# Patient Record
Sex: Female | Born: 1975 | Race: White | Hispanic: Yes | Marital: Single | Smoking: Never smoker
Health system: Southern US, Community
[De-identification: ages and names within clinical notes are randomized; demographics above are authoritative.]

## PROBLEM LIST (undated history)

## (undated) DIAGNOSIS — D649 Anemia, unspecified: Secondary | ICD-10-CM

## (undated) DIAGNOSIS — R7303 Prediabetes: Secondary | ICD-10-CM

## (undated) HISTORY — PX: HAND SURGERY: SHX662

## (undated) HISTORY — DX: Prediabetes: R73.03

## (undated) HISTORY — PX: TUBAL LIGATION: SHX77

---

## 2000-01-24 ENCOUNTER — Inpatient Hospital Stay (HOSPITAL_COMMUNITY): Admission: AD | Admit: 2000-01-24 | Discharge: 2000-01-24 | Payer: Self-pay | Admitting: Obstetrics & Gynecology

## 2000-03-12 ENCOUNTER — Inpatient Hospital Stay (HOSPITAL_COMMUNITY): Admission: AD | Admit: 2000-03-12 | Discharge: 2000-03-16 | Payer: Self-pay | Admitting: Obstetrics

## 2001-03-21 ENCOUNTER — Emergency Department (HOSPITAL_COMMUNITY): Admission: EM | Admit: 2001-03-21 | Discharge: 2001-03-21 | Payer: Self-pay | Admitting: Emergency Medicine

## 2006-02-27 ENCOUNTER — Encounter (INDEPENDENT_AMBULATORY_CARE_PROVIDER_SITE_OTHER): Payer: Self-pay | Admitting: Specialist

## 2006-02-27 ENCOUNTER — Inpatient Hospital Stay (HOSPITAL_COMMUNITY): Admission: AD | Admit: 2006-02-27 | Discharge: 2006-03-02 | Payer: Self-pay | Admitting: Obstetrics

## 2009-07-30 ENCOUNTER — Emergency Department (HOSPITAL_COMMUNITY): Admission: EM | Admit: 2009-07-30 | Discharge: 2009-07-30 | Payer: Self-pay | Admitting: Family Medicine

## 2009-08-01 ENCOUNTER — Ambulatory Visit (HOSPITAL_COMMUNITY): Admission: RE | Admit: 2009-08-01 | Discharge: 2009-08-01 | Payer: Self-pay | Admitting: Orthopedic Surgery

## 2010-07-18 LAB — DIFFERENTIAL
Basophils Absolute: 0 10*3/uL (ref 0.0–0.1)
Basophils Relative: 0 % (ref 0–1)
Eosinophils Absolute: 0.2 10*3/uL (ref 0.0–0.7)
Lymphocytes Relative: 21 % (ref 12–46)
Lymphs Abs: 1.6 10*3/uL (ref 0.7–4.0)
Monocytes Absolute: 0.4 10*3/uL (ref 0.1–1.0)
Neutro Abs: 5.4 10*3/uL (ref 1.7–7.7)

## 2010-07-18 LAB — CBC
MCHC: 31.3 g/dL (ref 30.0–36.0)
Platelets: 307 10*3/uL (ref 150–400)
RBC: 3.93 MIL/uL (ref 3.87–5.11)
RDW: 17.6 % — ABNORMAL HIGH (ref 11.5–15.5)
WBC: 7.6 10*3/uL (ref 4.0–10.5)

## 2010-07-18 LAB — COMPREHENSIVE METABOLIC PANEL
ALT: 20 U/L (ref 0–35)
CO2: 24 mEq/L (ref 19–32)
Chloride: 109 mEq/L (ref 96–112)
Potassium: 3.2 mEq/L — ABNORMAL LOW (ref 3.5–5.1)
Total Bilirubin: 0.5 mg/dL (ref 0.3–1.2)

## 2010-09-14 NOTE — Op Note (Signed)
[facility]  Patient:    Sarah Guerrero, Sarah Guerrero                     MRN: [id number] Proc. Date: 03/13/00 Adm. Date:  [id number] Attending:  Tammi Sou                           Operative Report  PREOPERATIVE DIAGNOSES:       1. Thirty-nine-plus-week intrauterine pregnancy.                               2. Premature rupture of membranes.                               3. Latent labor with repetitive severe variable                                  decelerations refractory to amnioinfusion.                               4. History of previous cesarean delivery.  POSTOPERATIVE DIAGNOSES:      1. Thirty-nine-plus-week intrauterine pregnancy.                               2. Premature rupture of membranes.                               3. Latent labor with repetitive severe variable                                  decelerations refractory to amnioinfusion.                               4. History of previous cesarean delivery.  OPERATION:                    Secondary low transverse cesarean section via Pfannenstiel.  SURGEON:                      Roseanna Rainbow, M.D.  ANESTHESIA:                   Epidural.  COMPLICATIONS:                None.  ESTIMATED BLOOD LOSS:         600 cc.  FLUIDS:                       1500 cc lactated Ringers.  URINE OUTPUT:                 200 cc clear urine.  INDICATIONS:                  The patient is a para 1 at 39+ weeks with premature rupture of membranes undergoing induction of labor with variable decelerations with oxytocin.  Maximum dilatation 1-2 cm.  FINDINGS:  Infant in cephalic presentation with a loose body cord.  Neonatology present at delivery.  Apgars 9 and 9.  Weight 5 pounds 12 ounces.  Normal uterus, tubes, and ovaries.  DESCRIPTION OF PROCEDURE:     The patient was taken to the operating room where her epidural anesthetic was found to be adequate.  She was then  prepped and draped in the normal sterile fashion in the dorsosupine supine with a leftward tilt.  A Pfannenstiel skin incision was then made due to the previous scar with the scalpel and carried through to the underlying layer of fascia. The fascia was incised in the midline and this incision extended laterally with Mayo scissors.  The superior aspect of the fascial incision was then grasped with Kocher clamps, elevated, and the underlying rectus muscles dissected off sharply.  Attention was then turned to the anterior aspect of this incision, which was manipulated in a similar fashion.  The rectus muscles were then divided in the midline, and the parietal peritoneum identified, extended up, and entered sharply with the scalpel.  The peritoneal incision was then extended superiorly and inferiorly with good visualization of the bladder.  The bladder blade was then inserted, and the vesicouterine peritoneum identified, grasped with pickups, and entered sharply with Metzenbaum scissors.  This incision was then extended laterally and a bladder flap created sharply.  The bladder blade was then reinserted and the lower uterine segment incised in a transverse fashion with the scalpel.  The uterine incision was then extended bluntly.  The bladder blade was removed and the infants head delivered atraumatically.  The nose and mouth were suctioned with the bulb suction and the cord clamped and cut.  The infant was handed off to the awaiting neonatologist.  The placenta was removed, the uterus cleared of all clots and debris.  The uterine incision was repaired with 0-Monocryl in a running lock fashion.  Excellent hemostasis was noted.  The gutters were cleared of all clots.  The fascia was reapproximated with 0-PDS in a running fashion.  The skin was closed with staples.  The patient tolerated the procedure well.  Sponge, lap, and needle counts were correct x 2.  The patient was taken to the PACU in  stable condition. DD:  03/13/00 TD:  03/13/00 Job: [id number]

## 2010-09-14 NOTE — Op Note (Signed)
NAMEJOHNIECE, Sarah Guerrero    ACCOUNT NO.:  [id number]   MEDICAL RECORD NO.:  [id number]          PATIENT TYPE:  INP   LOCATION:  9121                          FACILITY:  [facility]   PHYSICIAN:  Charles A. Clearance Coots, M.D.DATE OF BIRTH:  1975-12-05   DATE OF PROCEDURE:  02/27/2006  DATE OF DISCHARGE:                                 OPERATIVE REPORT   PREOPERATIVE DIAGNOSIS:  Term pregnancy, previous cesarean section x2,  desires a repeat cesarean section, desires permanent sterilization.   POSTOPERATIVE DIAGNOSIS:  Term pregnancy, previous cesarean section x2,  desires a repeat cesarean section, desires permanent sterilization.   PROCEDURE:  Repeat low transverse cesarean section, bilateral partial  salpingectomy.   SURGEON:  Coral Ceo, M.D.   ASSISTANT:  Kathreen Cosier, M.D.   ANESTHESIA:  Spinal.   ESTIMATED BLOOD LOSS:  800 mL   IV FLUIDS:  3700 mL   URINE OUTPUT:  400 mL Foley to gravity.   SPECIMEN:  Approximately 2 cm segments of right and left fallopian tubes.   FINDINGS:  Viable female at 0905, Apgars of 8 at one minute, 9 at five  minutes, weight of 6 pounds and 1 ounce, normal uterus, ovaries and  fallopian tubes.   OPERATION:  The patient was brought to the operating room and after  satisfactory spinal anesthesia, the abdomen was prepped and draped in the  usual sterile fashion, a Pfannenstiel skin incision was made through the  previous scar with a scalpel that was deepened down to the fascia with a  scalpel.  The fascia was nicked in the midline and the fascial incision was  extended to left and to the right with curved Mayo scissors, the superior  and inferior fascial edges were taken off the rectus muscles with both blunt  sharp dissection, the rectus muscle was then bluntly divided in the midline  and peritoneum was entered digitally and was digitally extended to left to  and to the right.  The bladder blade was then positioned and the  vesicouterine fold of peritoneum above the reflection of the urinary bladder  was grasped with forceps and was incised and undermined with Metzenbaum  scissors.  The incision was extended to left and to the right with  Metzenbaum scissors. The bladder flap was developed and the bladder blade  was repositioned in front of the urinary bladder placing it well out of  operative field.  The uterus was then entered transversely in the lower  uterine segment with a scalpel the uterine incision was extended to left and  to right with the bandage scissors.  The occiput was then brought up into  the incision and the vertex was delivered with the aid of fundal pressure  from the assistant. Infant's mouth and nose were suctioned with a suction  bulb and the delivery was completed with the aid of fundal pressure from the  assistant.  The umbilical cord was doubly clamped and cut and the infant was  handed off to the nursery staff.  Cord blood was obtained and the placenta  was manually removed from the uterus intact.  The endometrial surface was  then thoroughly debrided with a dry  lap sponge.  The edges of the uterine  incision were grasped with ring forceps and the uterus was closed with a  continuous interlocking suture of 0 Monocryl.  Hemostasis was excellent.  Attention was then turned above to the tubal ligation procedure.  The left  fallopian tube was grasped in the isthmic area with a Babcock clamp and was  followed from the corneal end to the fimbrial end following the grasp the  Babcock clamp.  A knuckle of tube beneath the Babcock clamp was then ligated  with #1 plain catgut and a section of tube above the knot was excised with  Metzenbaum scissors and submitted to pathology for evaluation.  There was no  active bleeding from the tubal stumps, therefore placed back in their normal  anatomic position.  Same procedure was performed on the opposite side  without complications.  The pelvic cavity  was then thoroughly irrigated with  warm saline solution and all clots were removed.  The abdomen was then  closed as follows. Peritoneum was closed with continuous suture of 2-0  Monocryl.  The fascia was  closed with continuous suture of 0 Vicryl.  Subcutaneous tissue was thoroughly irrigated with warm saline solution.  All  areas of subcutaneous bleeding were coagulated with the Bovie.  Skin was  then closed with continuous subcuticular suture of 3-0 Monocryl.  Sterile  bandage was applied to the incision closure.  The surgical technician  indicated that all needle, sponge and instrument counts were correct x2.  The patient tolerated the procedure well and transported to recovery room in  satisfactory condition.      Charles A. Clearance Coots, M.D.  Electronically Signed     CAH/MEDQ  D:  02/27/2006  T:  02/27/2006  Job:  [id number]

## 2010-09-14 NOTE — Discharge Summary (Signed)
[facility]  Patient:    Sarah Guerrero, Sarah Guerrero                       MRN: [id number] Adm. Date:  03/12/00 Disc. Date: 03/16/00 Dictator:   Maryelizabeth Rowan, M.D.                           Discharge Summary  HOSPITAL COURSE:              Patient was admitted with onset of labor.  A 35 year old G2, P2-0-0-2.  As labor progressed she had repetitive deep variable decelerations and was therefore taken for repeat low transverse cesarean section.  No postoperative complications.  Patient recovered well. Patient was breast-feeding well and on oral contraception for birth control. Staples were removed on day of discharge and Steri-Strips applied. Instructions were given and patient is instructed to follow-up at home health in six weeks for routine postpartum care. DD:  08/14/00 TD:  08/15/00 Job: [id number]

## 2010-09-14 NOTE — Discharge Summary (Signed)
NAMEERNESTENE, COOVER    ACCOUNT NO.:  [id number]   MEDICAL RECORD NO.:  [id number]          PATIENT TYPE:  INP   LOCATION:  9121                          FACILITY:  [facility]   PHYSICIAN:  Charles A. Clearance Coots, M.D.DATE OF BIRTH:  1975-10-07   DATE OF ADMISSION:  02/27/2006  DATE OF DISCHARGE:  03/02/2006                                 DISCHARGE SUMMARY   ADMITTING DIAGNOSES:  Term pregnancy, previous cesarean section x2,  multiparity, desires repeat cesarean section, desires permanent  sterilization.   DISCHARGE DIAGNOSES:  Term pregnancy, previous cesarean section x2,  multiparity, desires repeat cesarean section, desires permanent  sterilization, status post repeat low transverse cesarean section and  bilateral partial salpingectomy.   On February 27, 2006, a viable female delivered at [phone].  Apgars are 8 at 1  minute, 9 at 5 minutes, weight of 2,755 grams, length of 47 cm.  Mother and  infant discharged home in good condition.   REASON FOR ADMISSION:  A 35 year old Hispanic G3-P2 at term, desired repeat  cesarean section and permanent sterilization.   PAST MEDICAL HISTORY:  Surgery, cesarean section x2.   ILLNESSES:  None.   MEDICATIONS:  Prenatal vitamins.   ALLERGIES:  NO KNOWN DRUG ALLERGIES.   SOCIAL HISTORY:  Married.  Negative tobacco, alcohol or recreational drug  use.   PHYSICAL EXAM:  GENERAL:  Well-nourished, well-developed female in no acute  distress.  VITAL SIGNS:  Temperature 36.6, pulse 82, respiratory rate 16, blood  pressure 100/64.  LUNGS:  Clear to auscultation bilaterally.  HEART:  Regular rate and rhythm.  ABDOMEN:  Gravid, nontender.  PELVIC:  Exam was omitted.   ADMITTING LABORATORY VALUES:  Hemoglobin 12, hematocrit 36, white blood cell  count 6,000, platelets 157,000.   HOSPITAL COURSE:  The patient underwent a repeat low transverse cesarean  section and bilateral partial salpingectomy on February 27, 2006.  There are  no intraoperative  complications.  The patient's postoperative course was  uncomplicated.  The patient was discharged home on post-op day #3 in good  condition.   LABORATORY VALUES:  Hemoglobin 11, hematocrit 32, white blood cell count  6,000, platelets 144,000.   DISCHARGE DISPOSITION:  Medications:  Tylox and ibuprofen for pain.  Continue prenatal vitamins.  Routine written instructions were given for  obstetrical discharge after the dissection.  The patient is to call our  office for follow-up appointment in 2 weeks.      Charles A. Clearance Coots, M.D.  Electronically Signed     CAH/MEDQ  D:  03/12/2006  T:  03/12/2006  Job:  [id number]

## 2011-01-09 ENCOUNTER — Emergency Department (HOSPITAL_COMMUNITY): Payer: Self-pay

## 2011-01-09 ENCOUNTER — Emergency Department (HOSPITAL_COMMUNITY)
Admission: EM | Admit: 2011-01-09 | Discharge: 2011-01-09 | Disposition: A | Discharge status: Home / Self Care | Payer: Self-pay | Attending: Emergency Medicine | Admitting: Emergency Medicine

## 2011-01-09 DIAGNOSIS — R000 Tachycardia, unspecified: Secondary | ICD-10-CM | POA: Insufficient documentation

## 2011-01-09 DIAGNOSIS — R05 Cough: Secondary | ICD-10-CM | POA: Insufficient documentation

## 2011-01-09 DIAGNOSIS — R059 Cough, unspecified: Secondary | ICD-10-CM | POA: Insufficient documentation

## 2011-01-09 DIAGNOSIS — E7800 Pure hypercholesterolemia, unspecified: Secondary | ICD-10-CM | POA: Insufficient documentation

## 2011-01-09 DIAGNOSIS — R0602 Shortness of breath: Secondary | ICD-10-CM | POA: Insufficient documentation

## 2011-01-09 DIAGNOSIS — F411 Generalized anxiety disorder: Secondary | ICD-10-CM | POA: Insufficient documentation

## 2011-01-09 LAB — POCT I-STAT, CHEM 8
Chloride: 109 mEq/L (ref 96–112)
Creatinine, Ser: 0.6 mg/dL (ref 0.50–1.10)
Glucose, Bld: 95 mg/dL (ref 70–99)
Hemoglobin: 11.6 g/dL — ABNORMAL LOW (ref 12.0–15.0)
Potassium: 3.3 mEq/L — ABNORMAL LOW (ref 3.5–5.1)
TCO2: 20 mmol/L (ref 0–100)

## 2011-01-09 MED ORDER — IOHEXOL 300 MG/ML  SOLN
80.0000 mL | Freq: Once | INTRAMUSCULAR | Status: AC | PRN
  Administered 2011-01-09: 80 mL via INTRAVENOUS

## 2015-10-06 ENCOUNTER — Ambulatory Visit (INDEPENDENT_AMBULATORY_CARE_PROVIDER_SITE_OTHER): Payer: Self-pay | Admitting: Physician Assistant

## 2015-10-06 VITALS — BP 110/60 | HR 78 | Temp 97.8°F | Resp 14 | Ht 61.0 in | Wt 138.8 lb

## 2015-10-06 DIAGNOSIS — Z1329 Encounter for screening for other suspected endocrine disorder: Secondary | ICD-10-CM

## 2015-10-06 DIAGNOSIS — R87611 Atypical squamous cells cannot exclude high grade squamous intraepithelial lesion on cytologic smear of cervix (ASC-H): Secondary | ICD-10-CM

## 2015-10-06 DIAGNOSIS — R6882 Decreased libido: Secondary | ICD-10-CM

## 2015-10-06 DIAGNOSIS — L298 Other pruritus: Secondary | ICD-10-CM

## 2015-10-06 DIAGNOSIS — M79674 Pain in right toe(s): Secondary | ICD-10-CM

## 2015-10-06 DIAGNOSIS — Z13228 Encounter for screening for other metabolic disorders: Secondary | ICD-10-CM

## 2015-10-06 DIAGNOSIS — Z23 Encounter for immunization: Secondary | ICD-10-CM

## 2015-10-06 DIAGNOSIS — Z124 Encounter for screening for malignant neoplasm of cervix: Secondary | ICD-10-CM

## 2015-10-06 DIAGNOSIS — Z130 Encounter for screening for diseases of the blood and blood-forming organs and certain disorders involving the immune mechanism: Secondary | ICD-10-CM

## 2015-10-06 DIAGNOSIS — Z113 Encounter for screening for infections with a predominantly sexual mode of transmission: Secondary | ICD-10-CM

## 2015-10-06 DIAGNOSIS — Z0000 Encounter for general adult medical examination without abnormal findings: Secondary | ICD-10-CM

## 2015-10-06 DIAGNOSIS — N898 Other specified noninflammatory disorders of vagina: Secondary | ICD-10-CM

## 2015-10-06 DIAGNOSIS — L603 Nail dystrophy: Secondary | ICD-10-CM

## 2015-10-06 DIAGNOSIS — Z13220 Encounter for screening for lipoid disorders: Secondary | ICD-10-CM

## 2015-10-06 LAB — CBC WITH DIFFERENTIAL/PLATELET
BASOS ABS: 0 {cells}/uL (ref 0–200)
Basophils Relative: 0 %
EOS ABS: 124 {cells}/uL (ref 15–500)
EOS PCT: 2 %
HCT: 32.2 % — ABNORMAL LOW (ref 35.0–45.0)
Hemoglobin: 9.4 g/dL — ABNORMAL LOW (ref 11.7–15.5)
Lymphocytes Relative: 26 %
Lymphs Abs: 1612 cells/uL (ref 850–3900)
MCH: 20.4 pg — AB (ref 27.0–33.0)
MCHC: 29.2 g/dL — AB (ref 32.0–36.0)
MCV: 70 fL — AB (ref 80.0–100.0)
MONOS PCT: 6 %
MPV: 10.8 fL (ref 7.5–12.5)
Monocytes Absolute: 372 cells/uL (ref 200–950)
NEUTROS PCT: 66 %
Neutro Abs: 4092 cells/uL (ref 1500–7800)
PLATELETS: 383 10*3/uL (ref 140–400)
RBC: 4.6 MIL/uL (ref 3.80–5.10)
RDW: 16.4 % — ABNORMAL HIGH (ref 11.0–15.0)
WBC: 6.2 10*3/uL (ref 3.8–10.8)

## 2015-10-06 LAB — POCT URINALYSIS DIP (MANUAL ENTRY)
BILIRUBIN UA: NEGATIVE
BILIRUBIN UA: NEGATIVE
Blood, UA: NEGATIVE
GLUCOSE UA: NEGATIVE
NITRITE UA: NEGATIVE
Protein Ur, POC: NEGATIVE
Spec Grav, UA: 1.015
Urobilinogen, UA: 0.2
pH, UA: 7

## 2015-10-06 LAB — LIPID PANEL
CHOL/HDL RATIO: 3.3 ratio (ref <=5.0)
CHOLESTEROL: 167 mg/dL (ref 125–200)
HDL: 51 mg/dL (ref >=46)
LDL Cholesterol: 87 mg/dL (ref <130)
Triglycerides: 143 mg/dL (ref <150)
VLDL: 29 mg/dL (ref <30)

## 2015-10-06 LAB — POC MICROSCOPIC URINALYSIS (UMFC): MUCUS RE: ABSENT

## 2015-10-06 LAB — HEMOGLOBIN A1C
Hgb A1c MFr Bld: 5.7 % — ABNORMAL HIGH (ref <5.7)
MEAN PLASMA GLUCOSE: 117 mg/dL

## 2015-10-06 LAB — POCT WET + KOH PREP
TRICH BY WET PREP: ABSENT
YEAST BY KOH: ABSENT
Yeast by wet prep: ABSENT

## 2015-10-06 LAB — COMPREHENSIVE METABOLIC PANEL
ALK PHOS: 52 U/L (ref 33–115)
ALT: 12 U/L (ref 6–29)
AST: 17 U/L (ref 10–30)
Albumin: 4.1 g/dL (ref 3.6–5.1)
BUN: 9 mg/dL (ref 7–25)
CALCIUM: 8.9 mg/dL (ref 8.6–10.2)
CHLORIDE: 106 mmol/L (ref 98–110)
CO2: 21 mmol/L (ref 20–31)
Creat: 0.68 mg/dL (ref 0.50–1.10)
GLUCOSE: 83 mg/dL (ref 65–99)
POTASSIUM: 3.8 mmol/L (ref 3.5–5.3)
Sodium: 138 mmol/L (ref 135–146)
Total Bilirubin: 0.3 mg/dL (ref 0.2–1.2)
Total Protein: 7.5 g/dL (ref 6.1–8.1)

## 2015-10-06 LAB — TSH: TSH: 4.15 m[IU]/L

## 2015-10-06 NOTE — Patient Instructions (Addendum)
There is no evidence of infection in the vagina or in the urine at this point.  I recommend that you drink lots of water and use a baby diaper ointment (like Balmex or Desitin) to soothe the skin outside the vagina.  Please talk with your husband about your disinterest in sex, and your concerns that he may leave if he is not sexually satisfied.   If your liver tests are normal, which I expect they will be, I will send a prescription for Lamisil to your pharmacy, to treat the nail fungus.   IF you received an x-ray today, you will receive an invoice from [facility]. Please contact [facility] at [phone] with questions or concerns regarding your invoice.   IF you received labwork today, you will receive an invoice from [employer]. Please contact [facility] at [phone] with questions or concerns regarding your invoice.   Our billing staff will not be able to assist you with questions regarding bills from these companies.  You will be contacted with the lab results as soon as they are available. The fastest way to get your results is to activate your My Chart account. Instructions are located on the last page of this paperwork. If you have not heard from [place] regarding the results in 2 weeks, please contact this office.      Keeping You Healthy  Get These Tests 1. Blood Pressure- Have your blood pressure checked once a year by your health care provider.  Normal blood pressure is 120/80. 2. Weight- Have your body mass index (BMI) calculated to screen for obesity.  BMI is measure of body fat based on height and weight.  You can also calculate your own BMI at GravelBags.it. 3. Cholesterol- Have your cholesterol checked every 5 years starting at age 54 then yearly starting at age 72. 26. Chlamydia, HIV, and other sexually transmitted diseases- Get screened every year until age 55, then within three months of each new sexual  provider. 5. Pap Test - Every 1-5 years; discuss with your health care provider. 6. Mammogram- Every 1-2 years starting at age 57--50  Take these medicines  Calcium with Vitamin D-Your body needs 1200 mg of Calcium each day and [id number] IU of Vitamin D daily.  Your body can only absorb 500 mg of Calcium at a time so Calcium must be taken in 2 or 3 divided doses throughout the day.  Multivitamin with folic acid- Once daily if it is possible for you to become pregnant.  Get these Immunizations  Gardasil-Series of three doses; prevents HPV related illness such as genital warts and cervical cancer.  Menactra-Single dose; prevents meningitis.  Tetanus shot- Every 10 years.  Flu shot-Every year.  Take these steps 1. Do not smoke-Your healthcare provider can help you quit.  For tips on how to quit go to www.smokefree.gov or call 1-800 QUITNOW. 2. Be physically active- Exercise 5 days a week for at least 30 minutes.  If you are not already physically active, start slow and gradually work up to 30 minutes of moderate physical activity.  Examples of moderate activity include walking briskly, dancing, swimming, bicycling, etc. 3. Breast Cancer- A self breast exam every month is important for early detection of breast cancer.  For more information and instruction on self breast exams, ask your healthcare provider or https://www.patel.info/. 4. Eat a healthy diet- Eat a variety of healthy foods such as fruits, vegetables, whole grains, low fat milk, low fat cheeses, yogurt, lean meats, poultry  and fish, beans, nuts, tofu, etc.  For more information go to www. Thenutritionsource.org 5. Drink alcohol in moderation- Limit alcohol intake to one drink or less per day. Never drink and drive. 6. Depression- Your emotional health is as important as your physical health.  If you're feeling down or losing interest in things you normally enjoy please talk to your healthcare provider about  being screened for depression. 7. Dental visit- Brush and floss your teeth twice daily; visit your dentist twice a year. 8. Eye doctor- Get an eye exam at least every 2 years. 9. Helmet use- Always wear a helmet when riding a bicycle, motorcycle, rollerblading or skateboarding. 21. Safe sex- If you may be exposed to sexually transmitted infections, use a condom. 11. Seat belts- Seat belts can save your live; always wear one. 12. Smoke/Carbon Monoxide detectors- These detectors need to be installed on the appropriate level of your home. Replace batteries at least once a year. 13. Skin cancer- When out in the sun please cover up and use sunscreen 15 SPF or higher. 14. Violence- If anyone is threatening or hurting you, please tell your healthcare provider.

## 2015-10-06 NOTE — Progress Notes (Signed)
Patient ID: Sarah Guerrero, female     DOB: [birth year], 40 y.o.    MRN: [id number]  PCP: No PCP Per Patient  Chief Complaint  Patient presents with  . Annual Exam    With Pap  . Dysuria    x 5 days    Subjective:    HPI  Presents for Annual Wellness Exam. Her son's girlfriend is present and helps translate, though the patient understands and speaks fair [place].  She complains of vaginal itching, and mild external vaginal irritation with urination, but no burning, urinary urgency or frequency. She had some white vaginal discharge about 3 days ago associated with pelvic cramping when she sneezed or strained. She has a history of vaginal yeast infections, but this feels different. She has tried no type of treatment. No history of STI. She is married and has no other sexual partners. She does express some concern that her libido, which has always been "low" may drive her husband to leave her.  LEFT great toe pain x 3 weeks. No recalled trauma or injury. The pain has interfered with her typical 3 x/week jogging.  Last CPE was about 3 years ago. She recalls that at her last labs she was advised that she had "pre-diabetes" and a possible thyroid condition.   Prior to Admission medications   Not on File     No Known Allergies   There are no active problems to display for this patient.    History reviewed. No pertinent family history.   Social History   Social History  . Marital Status: Single    Spouse Name: N/A  . Number of Children: 3  . Years of Education: [employer] Sch[employer]   Occupational History  . house cleaner    Social History Main Topics  . Smoking status: Never Smoker   . Smokeless tobacco: Not on file  . Alcohol Use: 0.0 oz/week    0 Standard drinks or equivalent per week  . Drug Use: Not on file  . Sexual Activity: Yes    Birth Control/ Protection: Other-see comments     Comment: tubal ligation   Other Topics Concern  . Not on  file   Social History Narrative   Lives with Husband, 1 son and 3 daughters      From [place]        Review of Systems  Constitutional: Negative.   HENT: Negative.   Eyes: Negative.   Respiratory: Negative.   Cardiovascular: Negative.   Gastrointestinal: Negative.   Endocrine: Negative.   Genitourinary: Negative for dysuria, urgency, frequency, hematuria, flank pain, decreased urine volume, vaginal bleeding, vaginal discharge, enuresis, difficulty urinating, genital sores, vaginal pain, menstrual problem, pelvic pain and dyspareunia.       Vaginal itching. Low libido.  Musculoskeletal: Negative for myalgias, back pain, joint swelling, arthralgias, gait problem, neck pain and neck stiffness.       LEFT great toe pain  Skin: Negative.   Allergic/Immunologic: Negative.   Neurological: Negative.   Hematological: Negative.   Psychiatric/Behavioral: Negative.          Objective:  Physical Exam  Constitutional: She is oriented to person, place, and time. Vital signs are normal. She appears well-developed and well-nourished. She is active and cooperative. No distress.  BP 110/60 mmHg  Pulse 78  Temp(Src) 97.8 F (36.6 C) (Oral)  Resp 14  Ht 5\' 1"  (1.549 m)  Wt 138 lb 12.8 oz (62.959 kg)  BMI 26.24 kg/m2  SpO2 100%  LMP 09/18/2015   HENT:  Head: Normocephalic and atraumatic.  Right Ear: Hearing, tympanic membrane, external ear and ear canal normal. No foreign bodies.  Left Ear: Hearing, tympanic membrane, external ear and ear canal normal. No foreign bodies.  Nose: Nose normal.  Mouth/Throat: Uvula is midline, oropharynx is clear and moist and mucous membranes are normal. No oral lesions. Normal dentition. No dental abscesses or uvula swelling. No oropharyngeal exudate.  Eyes: Conjunctivae, EOM and lids are normal. Pupils are equal, round, and reactive to light. Right eye exhibits no discharge. Left eye exhibits no discharge. No scleral icterus.  Fundoscopic exam:       The right eye shows no arteriolar narrowing, no AV nicking, no exudate, no hemorrhage and no papilledema.       The left eye shows no arteriolar narrowing, no AV nicking, no exudate, no hemorrhage and no papilledema.  Neck: Trachea normal, normal range of motion and full passive range of motion without pain. Neck supple. No spinous process tenderness and no muscular tenderness present. No thyroid mass and no thyromegaly present.  Cardiovascular: Normal rate, regular rhythm, normal heart sounds, intact distal pulses and normal pulses.   Pulmonary/Chest: Effort normal and breath sounds normal. She exhibits no tenderness and no retraction. Right breast exhibits no inverted nipple, no mass, no nipple discharge, no skin change and no tenderness. Left breast exhibits no inverted nipple, no mass, no nipple discharge, no skin change and no tenderness. Breasts are symmetrical.  Abdominal: Soft. Normal appearance and bowel sounds are normal. She exhibits no distension and no mass. There is no hepatosplenomegaly. There is no tenderness. There is no rigidity, no rebound, no guarding, no CVA tenderness, no tenderness at McBurney's point and negative Murphy's sign. No hernia. Hernia confirmed negative in the right inguinal area and confirmed negative in the left inguinal area.  Genitourinary: Uterus normal. No breast swelling, tenderness, discharge or bleeding. Pelvic exam was performed with patient supine. No labial fusion. There is no rash, tenderness, lesion or injury on the right labia. There is no rash, tenderness, lesion or injury on the left labia. Cervix exhibits no motion tenderness, no discharge and no friability. Right adnexum displays no mass, no tenderness and no fullness. Left adnexum displays no mass, no tenderness and no fullness. There is tenderness in the vagina. No erythema or bleeding in the vagina. No foreign body around the vagina. No signs of injury around the vagina. Vaginal discharge found.    Musculoskeletal: She exhibits no edema or tenderness.       Cervical back: Normal.       Thoracic back: Normal.       Lumbar back: Normal.  Lymphadenopathy:       Head (right side): No tonsillar, no preauricular, no posterior auricular and no occipital adenopathy present.       Head (left side): No tonsillar, no preauricular, no posterior auricular and no occipital adenopathy present.    She has no cervical adenopathy.    She has no axillary adenopathy.       Right: No inguinal and no supraclavicular adenopathy present.       Left: No inguinal and no supraclavicular adenopathy present.  Neurological: She is alert and oriented to person, place, and time. She has normal strength and normal reflexes. No cranial nerve deficit. She exhibits normal muscle tone. Coordination and gait normal.  Skin: Skin is warm, dry and intact. No rash noted. She is not diaphoretic. No cyanosis or erythema. Nails show no clubbing.  RIGHT great toenail is thickened and lifted from the nail bed, consistent with onychomycosis.  Psychiatric: She has a normal mood and affect. Her speech is normal and behavior is normal. Judgment and thought content normal.    Results for orders placed or performed in visit on 10/06/15  POCT Wet + KOH Prep  Result Value Ref Range   Yeast by KOH Absent Present, Absent   Yeast by wet prep Absent Present, Absent   WBC by wet prep Few None, Few, Too numerous to count   Clue Cells Wet Prep HPF POC Few (A) None, Too numerous to count   Trich by wet prep Absent Present, Absent   Bacteria Wet Prep HPF POC None None, Few, Too numerous to count   Epithelial Cells By [employer] ([facility]) Moderate (A) None, Few, Too numerous to count   RBC,UR,HPF,POC None None RBC/hpf  POCT urinalysis dipstick  Result Value Ref Range   Color, UA yellow yellow   Clarity, UA clear clear   Glucose, UA negative negative   Bilirubin, UA negative negative   Ketones, POC UA negative negative   Spec Grav, UA 1.015     Blood, UA negative negative   pH, UA 7.0    Protein Ur, POC negative negative   Urobilinogen, UA 0.2    Nitrite, UA Negative Negative   Leukocytes, UA small (1+) (A) Negative  POCT Microscopic Urinalysis ([facility])  Result Value Ref Range   WBC,UR,HPF,POC None None WBC/hpf   RBC,UR,HPF,POC None None RBC/hpf   Bacteria None None, Too numerous to count   Mucus Absent Absent   Epithelial Cells, UR Per Microscopy None None, Too numerous to count cells/hpf            Assessment & Plan:  1. Annual physical exam Age appropriate anticipatory guidance provided.  2. Screening for hyperlipidemia - Lipid panel  3. Screening examination for sexually transmitted disease - HIV antibody - Pap IG, CT/NG NAA, and HPV (high risk) - RPR  4. Screening for metabolic disorder - Comprehensive metabolic panel - Hemoglobin A1c - POCT urinalysis dipstick - POCT Microscopic Urinalysis ([facility])  5. Screening for cervical cancer - Pap IG, CT/NG NAA, and HPV (high risk)  6. Screening for thyroid disorder - TSH  7. Screening for deficiency anemia - CBC with Differential/Platelet  8. Need for Tdap vaccination - Tdap vaccine greater than or equal to 7yo IM  9. Vaginal itching No evidence of infection. Encouraged topical barrier cream (Balmex, Desitin). Await labs above. - POCT Wet + KOH Prep  10. Great toe pain, right Due to trauma of thickened nail against shoe. See below.  11. Dystrophic nail Most likely onychomycosis. If liver enzymes are normal, plan to send Lamisil to the pharmacy.  12. Decreased libido without sexual dysfunction Encouraged her to discuss her concerns with her husband to see if this is actually a problem in their relationship. If it is, consider testosterone.   Fara Chute, PA-C Physician Assistant-Certified [facility]

## 2015-10-06 NOTE — Progress Notes (Signed)
Subjective:    Patient ID: Sarah Guerrero, female    DOB: December 15, 1975, 39 y.o.   MRN: [id number]  Chief Complaint  Patient presents with  . Annual Exam    With Pap  . Dysuria    x 5 days   HPI  Patient is 40yo female who presents today for her annual exam with complaints of constant vaginal itching and mild irritation associated with urination.  Admits to 1 episode of a white discharge 3 days ago and a cramping pain in lower pelvis with sneezing and straining.  Denies odor, urinary urgency, frequency, hematuria or incontinence.  History of yeast infections, adequately treated with monostat, but states it does not feel the same and she has not tried any form of treatment.  No prior history of STI.  Currently in a monogamous relationship.  Complains of a decreased libido.  States she has never had a large desire for sexual intercourse but lately has found her low desire diminishing.  She is concerned her husband will get fed up and leave her.   Complains of pain in her left great toe x 3weeks.  Denies trauma, swelling, erythema, or drainage.  Patient diet consist of a variety of foods including fruits, vegetables, and lots of carbs and sugars.  She exercises by running several days out of the week but has decreased recently because of the pain in her toe.    Admits it has been 3 years since her last physical and she currently does not have a primary care provider.  At her last physical she had some abnormal lab work indicating a possible thyroid condition and pre-diabetes.  No lab results for previous A1C or TSH found in EMR.  She is married, has 3 children and had some form of tubal ligation with her last pregnancy.   LMP - 09/18/2015  Patient speaks spanish, and has a minimally fair understanding of english.  Her son's girlfriend is present with her today as an interpretor.  Review of Systems  Constitutional: Negative for fever, activity change and appetite  change.  Cardiovascular: Negative for chest pain, palpitations and leg swelling.  Gastrointestinal: Negative for nausea, vomiting, abdominal pain, diarrhea, constipation, blood in stool and abdominal distention.  Endocrine: Negative for cold intolerance, heat intolerance, polydipsia, polyphagia and polyuria.       Objective:   Physical Exam  Constitutional: She is oriented to person, place, and time. She appears well-developed and well-nourished.  HENT:  Mouth/Throat: Oropharynx is clear and moist.  Eyes: Conjunctivae and EOM are normal. Pupils are equal, round, and reactive to light.  Neck: Normal range of motion. Neck supple. No tracheal deviation present. No thyromegaly present.  Cardiovascular: Normal rate, regular rhythm, normal heart sounds and intact distal pulses.   Pulmonary/Chest: Effort normal and breath sounds normal.  Abdominal: Soft. Bowel sounds are normal. She exhibits no distension. There is no tenderness.  Genitourinary: No breast swelling, tenderness or discharge. There is no rash, tenderness or lesion on the right labia. There is no rash, tenderness or lesion on the left labia. Uterus is not enlarged, not fixed and not tender. Cervix exhibits no motion tenderness, no discharge and no friability. Right adnexum displays no mass and no tenderness. Left adnexum displays no mass and no tenderness. There is tenderness in the vagina. No erythema or bleeding in the vagina. Vaginal discharge found.  Moderate white discharge present in vaginal vault.  Vaginal vault appears shallow.  Musculoskeletal: Normal range of motion.  Feet:  Neurological: She is alert and oriented to person, place, and time. She has normal reflexes.  Skin: Skin is warm and dry.  Psychiatric: She has a normal mood and affect. Her behavior is normal. Judgment and thought content normal.      Assessment & Plan:  1. Annual physical exam Patient is well appearing, healthy, female with no gross  abnormalities.  2. Screening for hyperlipidemia Results pending, will call patient if abnormalities present and determine management options. - Lipid panel  3. Screening examination for sexually transmitted disease On pelvic exam, no lesions, rash or erythema noted.  Samples collected, results pending, will provide treatment if indicated. - HIV antibody - Pap IG, CT/NG NAA, and HPV (high risk) - RPR  4. Screening for metabolic disorder Urinalysis within normal limits. CMP and A1C pending, will report results to patient and determine management if indicated. - Comprehensive metabolic panel - Hemoglobin A1c - POCT urinalysis dipstick - POCT Microscopic Urinalysis ([facility])  5. Screening for cervical cancer Results pending - Pap IG, CT/NG NAA, and HPV (high risk)  6. Screening for thyroid disorder Results pending, if levels abnormal will contact patient for further evaluation and management. - TSH  7. Screening for deficiency anemia Labs pending, will manage as necessary. - CBC with Differential/Platelet  8. Need for Tdap vaccination Given today. - Tdap vaccine greater than or equal to 7yo IM  9. Vaginal itching Wet and KOH prep results within normal limits, suggested patient get OTC diaper rash cream to use to treat symptoms.  Contact this office if symptoms persist for further management. - POCT Wet + KOH Prep  10. Great toe pain, right 11. Dystrophic nail Informed patient if liver function test return normal will send prescription for nail fungus treatment to her pharmacy to treat her likely infection with instructions on its use.  12. Decreased libido without sexual dysfunction Informed patient there are limited options to treat decreased libido in women. Encouraged patient to talk to her husband about her declining interest in sex, to open the doors of communication and possibly strengthen their relationship.  Will look into possible options to manage her decreased  libido.  Informed patient we will contact her with her lab results and address any abnormalities that need to be managed.  If she has any additional concerns or complaints to please contact or return to this office.   Karleen Seebeck P. Forest Pruden, PA-S

## 2015-10-07 LAB — HIV ANTIBODY (ROUTINE TESTING W REFLEX): HIV: NONREACTIVE

## 2015-10-07 LAB — RPR

## 2015-10-10 LAB — PAP IG, CT-NG NAA, HPV HIGH-RISK
CHLAMYDIA PROBE AMP: NOT DETECTED
GC PROBE AMP: NOT DETECTED
HPV DNA High Risk: NOT DETECTED

## 2015-10-19 ENCOUNTER — Ambulatory Visit: Payer: Self-pay

## 2015-10-19 VITALS — BP 132/74 | HR 67 | Temp 98.2°F | Resp 17 | Ht 61.5 in | Wt 140.0 lb

## 2015-10-19 NOTE — Patient Instructions (Signed)
     IF you received an x-ray today, you will receive an invoice from [facility]. Please contact [facility] at [phone] with questions or concerns regarding your invoice.   IF you received labwork today, you will receive an invoice from [facility]. Please contact [facility] at [phone] with questions or concerns regarding your invoice.   Our billing staff will not be able to assist you with questions regarding bills from these companies.  You will be contacted with the lab results as soon as they are available. The fastest way to get your results is to activate your My Chart account. Instructions are located on the last page of this paperwork. If you have not heard from us regarding the results in 2 weeks, please contact this office.      

## 2015-10-19 NOTE — Addendum Note (Signed)
Addended by: Fara Chute on: 10/19/2015 02:03 PM   Modules accepted: Orders

## 2015-10-21 ENCOUNTER — Ambulatory Visit (INDEPENDENT_AMBULATORY_CARE_PROVIDER_SITE_OTHER): Payer: Self-pay | Admitting: Urgent Care

## 2015-10-21 VITALS — BP 118/68 | HR 71 | Temp 98.3°F | Resp 12 | Ht 62.0 in | Wt 138.0 lb

## 2015-10-21 DIAGNOSIS — B379 Candidiasis, unspecified: Secondary | ICD-10-CM

## 2015-10-21 DIAGNOSIS — D649 Anemia, unspecified: Secondary | ICD-10-CM

## 2015-10-21 DIAGNOSIS — R87611 Atypical squamous cells cannot exclude high grade squamous intraepithelial lesion on cytologic smear of cervix (ASC-H): Secondary | ICD-10-CM

## 2015-10-21 MED ORDER — FERROUS SULFATE 325 (65 FE) MG PO TBEC: 325.0000 mg | DELAYED_RELEASE_TABLET | Freq: Three times a day (TID) | ORAL | Status: DC

## 2015-10-21 MED ORDER — DOCUSATE SODIUM 50 MG PO CAPS: 50.0000 mg | ORAL_CAPSULE | Freq: Two times a day (BID) | ORAL | Status: DC

## 2015-10-21 MED ORDER — FLUCONAZOLE 150 MG PO TABS: 150.0000 mg | ORAL_TABLET | ORAL | Status: DC

## 2015-10-21 NOTE — Patient Instructions (Addendum)
Vaginitis monilisica (Monilial Vaginitis) La vaginitis es una inflamacin (irritacin, hinchazn) de la vagina y la vulva. Esta no es una enfermedad de transmisin sexual.  CAUSAS Este tipo de vaginitis lo causa un hongo (candida) que normalmente se encuentra en la vagina. El hongo candida se ha desarrollado hasta el punto de ocasionar problemas en el equilibrio qumico. SNTOMAS  Secrecin vaginal espesa y blanca.  Hinchazn, picazn, enrojecimiento e inflamacin de la vagina y en algunos casos de los labios vaginales (vulva).  Ardor o dolor al [employer].  Dolor en [place]. DIAGNSTICO Los factores que favorecen la vaginitis moniliasica son:  Kyla Balzarine de virginidad y postmenopusicas.  Embarazo.  Infecciones.  Sentir cansancio, estar enferma o estresada, especialmente si ya ha sufrido este problema en el pasado.  Diabetes Buen control ayudar a disminur la probabilidad.  Pldoras anticonceptivas  Ropa interior [place].  El uso de espumas de bao, aerosoles femeninos duchas vaginales o tampones con desodorante.  Algunos antibiticos (medicamentos que destruyen grmenes).  Si contrae alguna enfermedad puede sufrir recurrencias espordicas. [place] profesional que lo asiste prescribir medicamentos.  Hay diferentes tipos de cremas y supositorios vaginales que tratan especficamente la vaginitis monilisica. Para infecciones por hongos recurrentes, utilice un supositorio o crema en la vagina dos veces por semana, o segn se le indique.  Tambin podrn utilizarse cremas con corticoides o anti monilisicas para la picazn o la irritacin de la vulva. Consulte con el profesional que la asiste.  Si la crema no da resultado, podr aplicarse en la vagina una solucin con azul de metileno.  El consumo de yogur puede prevenir este tipo de vaginitis. INSTRUCCIONES [place] todos los medicamentos tal como se le indic.  No  mantenga relaciones sexuales hasta que el tratamiento se haya completado, o segn las indicaciones del profesional que la asiste.  Tome baos de asiento tibios.  No se aplique duchas vaginales.  No utilice tampones, especialmente los perfumados.  Use ropa interior de algodn  [employer] pantalones ajustados y las medias tipo panty.  Comunique a sus compaeros sexuales que sufre una infeccin por hongos. Ellos deben concurrir para un control mdico si tienen sntomas como una urticaria leve o picazn.  Sus compaeros sexuales deben tratarse tambin si la infeccin es difcil de Radiographer, therapeutic.  Practique el sexo seguro - use condones  Algunos medicamentos vaginales ocasionan fallas en los condones de ltex. Los medicamentos vaginales que pueden daar los condones son:  Building services engineer cleocina  Butoconazole (Femstat)  Terconazole (Terazol) supositorios vaginales  Miconazole (Monistat) (es un medicamento de venta libre) SOLICITE ATENCIN MDICA SI:  Waldron Session tiene una temperatura oral de ms de 38,9 C (102 F).  Si la infeccin empeora luego de 2 das de tratamiento.  Si la infeccin no mejora luego de 3 das de tratamiento.  Aparecen ampollas en o alrededor de la vagina.  Si aparece una hemorragia vaginal y no es el momento del perodo.  Siente dolor al [employer].  Presenta problemas intestinales.  Tiene dolor durante las [employer].   Esta informacin no tiene Marine scientist el consejo del mdico. Asegrese de hacerle al mdico cualquier pregunta que tenga.   Document Released: 01/23/2005 Document Revised: 07/08/2011 Elsevier Interactive Patient Education 2016 [place].    Anemia inespecfica (Anemia, Nonspecific) La anemia es una enfermedad en la que la concentracin de glbulos rojos o el nivel de hemoglobina en la sangre estn por debajo de lo normal. La hemoglobina es la sustancia de los glbulos rojos  que lleva el oxgeno a todo el cuerpo. La anemia da como  resultado que los tejidos no reciban la cantidad suficiente de oxgeno.  CAUSAS  Las causas ms frecuentes de anemia son:   Elvina Mattes. El sangrado puede ser interno o externo. Incluye sangrado excesivo debido al perodo (en las mujeres) o por los intestinos.   Dficit nutricional.   Enfermedad renal, tiroidea o heptica crnicas.  Enfermedades de la mdula sea que disminuyen la produccin de glbulos rojos.  Cncer y tratamientos para Science writer.  VIH, sida y sus tratamientos.  Trastornos del bazo que aumentan la destruccin de glbulos rojos.  Enfermedades de [employer].  Destruccin excesiva de glbulos rojos debido a una infeccin, a medicamentos y a Nurse, mental health. SIGNOS Y SNTOMAS   Debilidad leve.   Mareos.   Dolor de [place].  Palpitaciones.   Falta de aire, especialmente con el ejercicio.   Palidez.  Sensibilidad al fro.  Indigestin.  Nuseas.  Dificultad para dormir.  Dificultad para concentrarse. Los sntomas pueden ocurrir repentinamente o pueden Psychologist, forensic.  DIAGNSTICO  Con frecuencia es necesario realizar anlisis de [employer]. Estos ayudan al profesional a Adult nurse. Su mdico controlar la materia fecal para Hydrographic surveyor la presencia de [place] y buscar otras causas de prdida de [place].  TRATAMIENTO  El tratamiento vara segn la causa de la anemia. Las opciones de tratamiento son:   Suplementos de hierro, vitamina 123456, o cido flico.   Medicamentos con hormonas.   Transfusin de [place]. Ser necesaria en los casos de prdida de [place] grave.   Hospitalizacin. Ser necesaria si la prdida de sangre es continua y significativa.   Cambios en la dieta.  Extirpacin del bazo. INSTRUCCIONES PARA EL CUIDADO EN EL HOGAR Cumpla con todas las visitas de control. Generalmente demora varias semanas corregir la anemia, y es muy importante que el mdico controle su enfermedad y su  respuesta al [place]. SOLICITE ATENCIN MDICA DE INMEDIATO SI:   Siente debilidad extrema, falta de aire o dolor en el pecho.   Se siente mareado o tiene dificultad para concentrarse.  Tiene una hemorragia vaginal abundante.   Aparece una erupcin cutnea.   La materia fecal es negra, de aspecto alquitranado.   Se desmaya.   Vomita sangre.   Vomita repetidas veces.   Siente dolor abdominal.  Tiene fiebre o sntomas persistentes durante ms de 2 - 3 das.   Tiene fiebre y los sntomas empeoran repentinamente.   Se deshidrata.  ASEGRESE DE QUE:  Comprende estas instrucciones.  Controlar su afeccin.  Recibir ayuda de inmediato si no mejora o si empeora.   Esta informacin no tiene Marine scientist el consejo del mdico. Asegrese de hacerle al mdico cualquier pregunta que tenga.   Document Released: 04/15/2005 Document Revised: 12/16/2012 Elsevier Interactive Patient Education [employer].     IF you received an x-ray today, you will receive an invoice from [facility]. Please contact [facility] at [phone] with questions or concerns regarding your invoice.   IF you received labwork today, you will receive an invoice from [employer]. Please contact [facility] at [phone] with questions or concerns regarding your invoice.   Our billing staff will not be able to assist you with questions regarding bills from these companies.  You will be contacted with the lab results as soon as they are available. The fastest way to get your results is to activate your My Chart account. Instructions are located on the last  page of this paperwork. If you have not heard from [place] regarding the results in 2 weeks, please contact this office.

## 2015-10-21 NOTE — Progress Notes (Signed)
    MRN: [id number] DOB: [birth year]  Subjective:   Sarah Guerrero is a 40 y.o. female presenting for follow up on lab results.   Patient was seen for an annual physical on 10/06/2015. Pap smear showed ASCUS, candida infection. She has since been referred to Dr. Toney Rakes with [facility]. Patient is concerned because her mother passed away from metastatic cancer. She reports that she has had vaginal itching, dryness, discomfort with sex. She has regular cycles, no heavy flow but admits intermittent vaginal discharge. She also has anemia, has a history of this. She does not take any iron supplements and has not had this worked up.   Sarah Guerrero currently has no medications in their medication list. Also has No Known Allergies.  Sarah Guerrero  has a past medical history of Pre-diabetes. Also  has past surgical history that includes Tubal ligation.  Her family history includes Cancer in her mother.   Objective:   Vitals: BP 118/68 mmHg  Pulse 71  Temp(Src) 98.3 F (36.8 C) (Oral)  Resp 12  Ht 5\' 2"  (1.575 m)  Wt 138 lb (62.596 kg)  BMI 25.23 kg/m2  SpO2 100%  LMP 10/17/2015  Physical Exam  Constitutional: She is oriented to person, place, and time. She appears well-developed and well-nourished.  Cardiovascular: Normal rate.   Pulmonary/Chest: Effort normal.  Neurological: She is alert and oriented to person, place, and time.  Psychiatric: Her mood appears anxious.   Assessment and Plan :   1. Candida infection - Start diflucan, take 2 doses 1 week apart.  2. Pap smear of cervix with ASCUS, cannot exclude HGSIL - Counseled her on this diagnosis. She is aware that she will have to undergo colposcopy with Dr. Toney Rakes.  3. Anemia, unspecified - Start iron supplement, docusate for constipation. Recheck in 4 weeks.  Sarah Eagles, PA-C [facility] [phone] 10/21/2015 8:28 AM

## 2015-11-07 ENCOUNTER — Encounter: Payer: Self-pay | Admitting: Gynecology

## 2015-11-07 ENCOUNTER — Ambulatory Visit (INDEPENDENT_AMBULATORY_CARE_PROVIDER_SITE_OTHER): Payer: Self-pay | Admitting: Gynecology

## 2015-11-07 VITALS — BP 120/86 | Ht 61.0 in | Wt 141.0 lb

## 2015-11-07 DIAGNOSIS — IMO0002 Reserved for concepts with insufficient information to code with codable children: Secondary | ICD-10-CM

## 2015-11-07 DIAGNOSIS — N941 Unspecified dyspareunia: Secondary | ICD-10-CM | POA: Insufficient documentation

## 2015-11-07 DIAGNOSIS — Z87410 Personal history of cervical dysplasia: Secondary | ICD-10-CM | POA: Insufficient documentation

## 2015-11-07 DIAGNOSIS — R102 Pelvic and perineal pain: Secondary | ICD-10-CM

## 2015-11-07 DIAGNOSIS — R896 Abnormal cytological findings in specimens from other organs, systems and tissues: Secondary | ICD-10-CM

## 2015-11-07 NOTE — Progress Notes (Addendum)
   HPI: Patient is a 40 year old with prior tubal ligation was referred to our practice today as a courtesy of her PCP Chelle Jeffrey,PA-C as a result of her recent abnormal Pap smear which had demonstrated the following:   EPITHELIAL CELL ABNORMALITY: ATYPICAL SQUAMOUS CELLS, CANNOT EXCLUDE  HSIL (ASC-H).   Negative HPV  Patient also stating that she has had lower abdominal discomfort and dyspareunia. She reports normal menstrual cycle. She is in the process of being evaluated by her primary care physician because of her hemoglobin of 9.4 and hematocrit 32.2 for which she's currently on iron supplementation.  ROS: A ROS was performed and pertinent positives and negatives are included in the history.  GENERAL: No fevers or chills. HEENT: No change in vision, no earache, sore throat or sinus congestion. NECK: No pain or stiffness. CARDIOVASCULAR: No chest pain or pressure. No palpitations. PULMONARY: No shortness of breath, cough or wheeze. GASTROINTESTINAL: No abdominal pain, nausea, vomiting or diarrhea, melena or bright red blood per rectum. GENITOURINARY: No urinary frequency, urgency, hesitancy or dysuria. MUSCULOSKELETAL: No joint or muscle pain, no back pain, no recent trauma. DERMATOLOGIC: No rash, no itching, no lesions. ENDOCRINE: No polyuria, polydipsia, no heat or cold intolerance. No recent change in weight. HEMATOLOGICAL: No anemia or easy bruising or bleeding. NEUROLOGIC: No headache, seizures, numbness, tingling or weakness. PSYCHIATRIC: No depression, no loss of interest in normal activity or change in sleep pattern.   [id number] pressure 120/86 weight 141 pounds   BMI 26.64 kg/m Gen. appearance well-developed well-nourished female with the above-mentioned complaint Pelvic: Bartholin urethra Skene was within normal limits Vagina: No lesions or discharge Cervix: No lesions or discharge Uterus: Anteverted normal size shape and consistency patient tender specimen more steroids her left  adnexa difficult to discern any large mass. Rectal exam: Not done  Patient then underwent a detail colposcopic evaluation. The external genitalia, perineum and perirectal region were inspected and no lesions were seen. The speculum was introduced into the vagina and a systematic inspection did not demonstrate any lesions in the vagina, fornix or cervix. Acetic acid wasn't applied and no lesions were seen. Then the cervical speculum was utilized transformation sound was visualized and a vigorous ECC was obtained and submitted for histological evaluation.      Assessment Plan: #1 Pap smear with atypical squamous cells of undetermined significance negative HPV but pathologist had mentioned that he could not rule out any higher grade lesion she had a negative colposcopy today. We discussed the   new Pap smear guidelines. Information provided in Spanish. #2 visit result of low abdominal discomfort and tenderness in left lower quadrant and ultrasound will be scheduled here in the office the next few weeks.    Greater than 50% of time was spent in counseling and coordinating care of this patient.   Time of consultation: 30   Minutes.

## 2015-11-07 NOTE — Patient Instructions (Signed)
Colposcopa - Cuidados posteriores (Colposcopy, Care After) Siga estas instrucciones durante las prximas semanas. Estas indicaciones le proporcionan informacin general acerca de cmo deber cuidarse despus del procedimiento. El mdico tambin podr darle instrucciones ms especficas. El tratamiento se ha planificado de acuerdo a las prcticas mdicas actuales, pero a veces se producen problemas. Comunquese con el mdico si tiene algn problema o tiene dudas despus del procedimiento. QU ESPERAR DESPUS DEL PROCEDIMIENTO  Despus del procedimiento, es tpico tener las siguientes sensaciones:  Clicos. Generalmente se calman en algunos minutos.  Dolor. [place].  Aturdimiento. Si esto le ocurre, recustese durante algunos minutos. Podr tener un sangrado leve o una secrecin oscura que debe detenerse en [place]. Durante este tiempo deber usar un apsito sanitario. [place] vaginales y el uso de tampones durante 3 das, o segn lo que le indique su mdico.  Tome slo medicamentos de venta libre o recetados, segn las indicaciones del mdico. No tome aspirina, ya que puede causar hemorragias.  Si utiliza pldoras anticonceptivas, contine tomndolas.  No todos los resultados estarn disponibles durante su visita. En este caso, tenga otra entrevista con su mdico para conocerlos. No suponga que es normal si no tiene noticias de su mdico o del establecimiento de salud. Es Building services engineer seguimiento de todos los [place] de [place].  Siga los consejos de su mdico con respecto a los [place], [place], visitas y [place] de control. SOLICITE ATENCIN MDICA SI:  Aparece una erupcin cutnea.  Tiene problemas con los medicamentos. SOLICITE ATENCIN MDICA DE INMEDIATO SI:  Tiene una hemorragia abundante o elimina cogulos.  Tiene fiebre.  Tiene flujo vaginal  anormal.  Tiene clicos que no se alivian luego de tomar analgsicos.  Se siente mareada, tiene vahdos o se desmaya.  Siente Research scientist (life sciences).   Esta informacin no tiene Marine scientist el consejo del mdico. Asegrese de hacerle al mdico cualquier pregunta que tenga.   Document Released: 02/03/2013 Elsevier Interactive Patient Education [employer].

## 2015-11-09 LAB — PATHOLOGY

## 2015-11-13 ENCOUNTER — Encounter: Payer: Self-pay | Admitting: Gynecology

## 2015-11-13 ENCOUNTER — Ambulatory Visit: Payer: Self-pay | Admitting: Gynecology

## 2015-11-29 ENCOUNTER — Ambulatory Visit (INDEPENDENT_AMBULATORY_CARE_PROVIDER_SITE_OTHER): Payer: Self-pay

## 2015-11-29 ENCOUNTER — Encounter: Payer: Self-pay | Admitting: Gynecology

## 2015-11-29 ENCOUNTER — Other Ambulatory Visit: Payer: Self-pay | Admitting: Gynecology

## 2015-11-29 ENCOUNTER — Ambulatory Visit (INDEPENDENT_AMBULATORY_CARE_PROVIDER_SITE_OTHER): Payer: Self-pay | Admitting: Gynecology

## 2015-11-29 VITALS — BP 128/84

## 2015-11-29 DIAGNOSIS — R102 Pelvic and perineal pain: Secondary | ICD-10-CM

## 2015-11-29 DIAGNOSIS — N941 Unspecified dyspareunia: Secondary | ICD-10-CM

## 2015-11-29 DIAGNOSIS — N8312 Corpus luteum cyst of left ovary: Secondary | ICD-10-CM

## 2015-11-29 DIAGNOSIS — R896 Abnormal cytological findings in specimens from other organs, systems and tissues: Secondary | ICD-10-CM

## 2015-11-29 DIAGNOSIS — IMO0002 Reserved for concepts with insufficient information to code with codable children: Secondary | ICD-10-CM

## 2015-11-29 NOTE — Progress Notes (Signed)
   Patient is a 40 year old that was seen the office as a new patient has a result of referral by her PCP due to abnormal Pap smear which had demonstrated the following:   EPITHELIAL CELL ABNORMALITY: ATYPICAL SQUAMOUS CELLS, CANNOT EXCLUDE  HSIL (ASC-H).   Negative HPV  Patient also stating that she has had lower abdominal discomfort and dyspareunia. She reports normal menstrual cycle. She is in the process of being evaluated by her primary care physician because of her hemoglobin of 9.4 and hematocrit 32.2 for which she's currently on iron supplementation.  She had a negative colposcopy with a negative ECC was instructed to return for follow-up Pap smear in 6 months.  Her ultrasound today demonstrated the following: Uterus measured 10.9 x 7.1 x 5.1 cm with endometrial stripe of 12.9 mm (last menstrual period 11/10/2015/tubal ligation) right ovary was normal. Left ovary thick wall corpus luteum cyst measuring 18 x 23 mm with positive color flow in the periphery. Some fluid was noted in the cul-de-sac 23 x 18 mm.  Assessment/plan: Patient with Pap smear demonstrated ASCUS but negative HPV. Negative colposcopy and benign ECC. Patient will return back to the office in 6 months for follow-up Pap smear. It appears that her discomfort during intercourse is because of inadequate lubrication. I have recommended she buy over-the-counter Astroglyde and use when necessary. Greater than 50% of the time was spent counseling coordinating of care for this patient total time of consultation 10 minutes

## 2016-03-18 ENCOUNTER — Other Ambulatory Visit (HOSPITAL_COMMUNITY): Payer: Self-pay | Admitting: *Deleted

## 2016-03-18 DIAGNOSIS — N644 Mastodynia: Secondary | ICD-10-CM

## 2016-04-18 ENCOUNTER — Ambulatory Visit
Admission: RE | Admit: 2016-04-18 | Discharge: 2016-04-18 | Disposition: A | Discharge status: Home / Self Care | Payer: No Typology Code available for payment source | Source: Ambulatory Visit | Attending: Obstetrics and Gynecology | Admitting: Obstetrics and Gynecology

## 2016-04-18 ENCOUNTER — Ambulatory Visit (HOSPITAL_COMMUNITY)
Admission: RE | Admit: 2016-04-18 | Discharge: 2016-04-18 | Disposition: A | Discharge status: Home / Self Care | Payer: Self-pay | Source: Ambulatory Visit | Attending: Obstetrics and Gynecology | Admitting: Obstetrics and Gynecology

## 2016-04-18 ENCOUNTER — Encounter (HOSPITAL_COMMUNITY): Payer: Self-pay

## 2016-04-18 VITALS — BP 118/72 | Temp 98.2°F | Ht 61.5 in | Wt 146.2 lb

## 2016-04-18 DIAGNOSIS — N644 Mastodynia: Secondary | ICD-10-CM

## 2016-04-18 DIAGNOSIS — Z1239 Encounter for other screening for malignant neoplasm of breast: Secondary | ICD-10-CM

## 2016-04-18 HISTORY — DX: Anemia, unspecified: D64.9

## 2016-04-18 NOTE — Patient Instructions (Signed)
Explained breast self awareness to [facility]. Patient did not need a Pap smear today due to last Pap smear was 10/06/2015. Let patient know that her next Pap smear is due in January 2018 due to her recent history of an abnormal Pap smear and results after colposcopy recommended a 6 month follow up Pap smear. Appointment scheduled with B[facility]CP on Tuesday, May 21, 2016 at 0900 for a Pap smear. Referred patient to the [facility] for diagnostic mammogram and possible left breast ultrasound. Appointment scheduled for Thursday, April 18, 2016. [place] verbalized understanding.  Maureena Dabbs, Arvil Chaco, RN 2:07 PM

## 2016-04-18 NOTE — Progress Notes (Signed)
Complaints of left breast pain x one year that comes and goes. Patient rates pain at a 5 out of 10.  Pap Smear:  Pap smear not completed today. Last Pap smear was 10/06/2015 and ASC-H. Patient had a colposcopy on 11/09/2015 to follow up for abnormal Pap smear that was benign. Per patient her last Pap smear is the only abnormal Pap smear that she has had. Last Pap smear and colposcopy result is in EPIC.   Physical exam: Breasts Breasts symmetrical. No skin abnormalities left breast. Skin on right breast a whitish discoloration observed. Per patient that whitish discoloration occurred 16 years ago. No nipple retraction bilateral breasts. No nipple discharge bilateral breasts. No lymphadenopathy. No lumps palpated bilateral breasts. Complaints of left uppper outer quadrant pain on exam. Referred patient to the [facility] for diagnostic mammogram and possible left breast ultrasound. Appointment scheduled for Thursday, April 18, 2016 at 1400.        Pelvic/Bimanual No Pap smear completed today since last Pap smear was 10/06/2015. Pap smear not indicated per BCCCP guidelines.   Smoking History: Patient has never smoked.  Patient Navigation: Patient education provided. Access to services provided for patient through [facility] program. Spanish interpreter provided.  Used Spanish interpreter [employer] from [place].

## 2016-05-03 ENCOUNTER — Encounter (HOSPITAL_COMMUNITY): Payer: Self-pay | Admitting: *Deleted

## 2016-05-21 ENCOUNTER — Encounter (HOSPITAL_COMMUNITY): Payer: Self-pay

## 2016-05-21 ENCOUNTER — Ambulatory Visit (HOSPITAL_COMMUNITY)
Admission: RE | Admit: 2016-05-21 | Discharge: 2016-05-21 | Disposition: A | Discharge status: Home / Self Care | Payer: Self-pay | Source: Ambulatory Visit | Attending: Obstetrics and Gynecology | Admitting: Obstetrics and Gynecology

## 2016-05-21 VITALS — BP 106/70 | Temp 98.7°F | Ht 60.0 in | Wt 146.8 lb

## 2016-05-21 DIAGNOSIS — Z01419 Encounter for gynecological examination (general) (routine) without abnormal findings: Secondary | ICD-10-CM

## 2016-05-21 NOTE — Progress Notes (Signed)
Complaints of vaginal discharge and pain during sexual intercourse.  Pap Smear: Pap smear completed today. Last Pap smear was 10/06/2015 and ASC-H. Patient had a colposcopy on 11/09/2015 to follow up for abnormal Pap smear that was benign. Per patient her last Pap smear is the only abnormal Pap smear that she has had. Last Pap smear and colposcopy result is in EPIC.   Pelvic/Bimanual   Ext Genitalia No lesions, no swelling and no discharge observed on external genitalia.         Vagina Vagina pink and normal texture. No lesions and yellowish colored discharge observed in vagina. Wet prep completed.          Cervix Cervix is present. Cervix pink and of normal texture. Small amount of yellowish colored discharge observed on cervix.    Uterus Uterus is present and palpable. Uterus in normal position and normal size.        Adnexae Bilateral ovaries present and palpable. No tenderness on palpation.          Rectovaginal No rectal exam completed today since patient had no rectal complaints. No skin abnormalities observed on exam.    Smoking History: Patient has never smoked.  Patient Navigation: Patient education provided. Access to services provided for patient through [facility] program. Spanish interpreter provided.  Used Spanish interpreter [employer] from [place].

## 2016-05-21 NOTE — Patient Instructions (Signed)
Explained to [facility] that follow up for today's Pap smear will be based on the result. Reminded patient that her next mammogram is due in December 2018 and that she will need to renew her BCCCP if still eligible to cover mammogram. Let patient know will follow up with her within the next couple weeks with results of Pap smear and wet prep by phone. [place] verbalized understanding.  Yvonne Stopher, Arvil Chaco, RN 10:59 AM

## 2016-05-21 NOTE — Progress Notes (Signed)
Pap smear completed

## 2016-05-22 ENCOUNTER — Telehealth (HOSPITAL_COMMUNITY): Payer: Self-pay | Admitting: *Deleted

## 2016-05-22 ENCOUNTER — Encounter (HOSPITAL_COMMUNITY): Payer: Self-pay | Admitting: *Deleted

## 2016-05-22 ENCOUNTER — Other Ambulatory Visit (HOSPITAL_COMMUNITY): Payer: Self-pay | Admitting: *Deleted

## 2016-05-22 DIAGNOSIS — B379 Candidiasis, unspecified: Secondary | ICD-10-CM

## 2016-05-22 MED ORDER — FLUCONAZOLE 150 MG PO TABS: 150.0000 mg | ORAL_TABLET | Freq: Once | ORAL | 0 refills | Status: AC

## 2016-05-22 NOTE — Telephone Encounter (Signed)
Telephoned patient at home number and advised patient wet prep results did show yeast. Medication was called into [facility] on [address] and [place]. Patient voiced understanding. Used interpreter Lavon Paganini.

## 2016-05-23 LAB — CYTOLOGY - PAP
DIAGNOSIS: NEGATIVE
HPV (WINDOPATH): NOT DETECTED

## 2016-06-03 ENCOUNTER — Telehealth (HOSPITAL_COMMUNITY): Payer: Self-pay | Admitting: *Deleted

## 2016-06-03 NOTE — Telephone Encounter (Signed)
Telephoned patient at home number and advised patient negative pap smear results. HPV was negative. Next pap smear due in one year. Patient voiced understanding.

## 2016-07-04 ENCOUNTER — Ambulatory Visit (INDEPENDENT_AMBULATORY_CARE_PROVIDER_SITE_OTHER): Payer: Self-pay | Admitting: Emergency Medicine

## 2016-07-04 VITALS — BP 112/76 | HR 70 | Temp 98.4°F | Resp 16 | Ht 62.0 in | Wt 147.2 lb

## 2016-07-04 DIAGNOSIS — B3731 Acute candidiasis of vulva and vagina: Secondary | ICD-10-CM

## 2016-07-04 DIAGNOSIS — B373 Candidiasis of vulva and vagina: Secondary | ICD-10-CM

## 2016-07-04 DIAGNOSIS — N949 Unspecified condition associated with female genital organs and menstrual cycle: Secondary | ICD-10-CM

## 2016-07-04 DIAGNOSIS — N941 Unspecified dyspareunia: Secondary | ICD-10-CM

## 2016-07-04 MED ORDER — FLUCONAZOLE 200 MG PO TABS: 200.0000 mg | ORAL_TABLET | Freq: Every day | ORAL | 1 refills | Status: AC

## 2016-07-04 NOTE — Patient Instructions (Addendum)
   IF you received an x-ray today, you will receive an invoice from [facility]. Please contact [facility] at [phone] with questions or concerns regarding your invoice.   IF you received labwork today, you will receive an invoice from [facility]. Please contact [facility] at [phone] with questions or concerns regarding your invoice.   Our billing staff will not be able to assist you with questions regarding bills from these companies.  You will be contacted with the lab results as soon as they are available. The fastest way to get your results is to activate your My Chart account. Instructions are located on the last page of this paperwork. If you have not heard from us regarding the results in 2 weeks, please contact this office.     Candidiasis vaginal en los adultos (Gastrointestinal Yeast Infection, Adult) La candidiasis vaginal es una afeccin que causa dolor, hinchazn y enrojecimiento (inflamacin) de la vagina. Tambin causa secrecin vaginal. Esta es una enfermedad frecuente. Algunas mujeres contraen esta infeccin con frecuencia. CAUSAS La causa de la infeccin es un cambio en el equilibrio normal de los hongos (cndida) y las bacterias que viven en la vagina. Esta alteracin deriva en el crecimiento excesivo de los hongos, lo que causa la inflamacin. FACTORES DE RIESGO Es ms probable que esta afeccin se manifieste en:  Las mujeres que toman antibiticos.  Las mujeres que tienen diabetes.  Las mujeres que toman anticonceptivos.  Las mujeres que estn embarazadas.  Las mujeres que se hacen duchas vaginales con frecuencia.  Las mujeres que tienen un sistema de defensa (inmunitario) dbil.  Las mujeres que han tomado corticoides durante mucho tiempo.  Las mujeres que usan ropa ajustada con frecuencia. SNTOMAS Los sntomas de esta afeccin incluyen lo siguiente:  Secrecin vaginal blanca y espesa.  Hinchazn, picazn, enrojecimiento e  irritacin de la vagina. Los labios de la vagina (vulva) tambin se pueden infectar.  Dolor o ardor al orinar.  Dolor durante las relaciones sexuales. DIAGNSTICO Esta afeccin se diagnostica mediante la historia clnica y un examen fsico. Este incluye un examen plvico. El mdico examinar una muestra de la secrecin vaginal con un microscopio. Probablemente el mdico enve esta muestra al laboratorio para analizarla y confirmar el diagnstico. TRATAMIENTO Esta afeccin se trata con medicamentos. Los medicamentos pueden ser recetados o de venta libre. Podrn indicarle que use uno o ms de lo siguiente:  Medicamentos por va oral.  Medicamentos que se aplican como una crema.  Medicamentos que se colocan directamente en la vagina (vulos vaginales). INSTRUCCIONES PARA EL CUIDADO EN EL HOGAR  Tome o aplquese los medicamentos de venta libre y recetados solamente como se lo haya indicado el mdico.  No tenga relaciones sexuales hasta que el mdico lo autorice. Comunique a su compaero sexual que tiene una infeccin por hongos. Esas personas deben consultar al mdico si tienen sntomas.  No use ropa ajustada, como pantis o pantalones ajustados.  Evite el uso de tampones hasta que el mdico lo autorice.  Consuma ms yogur. Esto puede ayudar a evitar la recurrencia de la candidiasis.  Intente darse un bao de asiento para aliviar las molestias. Se trata de un bao de agua tibia que se toma mientras se est sentado. El agua solo debe llegar hasta las caderas y cubrir las nalgas. Hgalo 3o 4veces al da o como se lo haya indicado el mdico.  No se haga duchas vaginales.  Use ropa interior transpirable de algodn.  Si tiene diabetes, mantenga bajo control los niveles de   azcar en la sangre. SOLICITE ATENCIN MDICA SI:  Jaclynn Guarneri.  Los sntomas desaparecen y Teacher, adult education.  Los sntomas no mejoran con Dispensing optician.  Los sntomas empeoran.  Aparecen nuevos  sntomas.  Aparecen ampollas alrededor o adentro de la vagina.  Le sale sangre de la vagina y no est menstruando.  Siente dolor en el abdomen. Esta informacin no tiene Marine scientist el consejo del mdico. Asegrese de hacerle al mdico cualquier pregunta que tenga. Document Released: 01/23/2005 Document Revised: 08/07/2015 Document Reviewed: 10/17/2014 Elsevier Interactive Patient Education  2017 [employer].

## 2016-07-04 NOTE — Progress Notes (Signed)
Sarah Guerrero 41 y.o.   Chief Complaint  Patient presents with  . vaginal discharge    per pt "always there", medication that was given has not worked    HISTORY OF PRESENT ILLNESS: This is a 41 y.o. female complaining of vaginal burning, discharge, and painful intercourse on and off x several months; was seen here last June and treated with Diflucan once; was seen by GYN office last January; Pap tests normal and cultures have been consistently positive for Candida; husband not having symptoms; tested for diabetes and negative; denies condom use; has been using OTC vaginal lubricants for intercourse; has decreased libido due to symptoms.   HPI   Prior to Admission medications   Medication Sig Start Date End Date Taking? Authorizing Provider  docusate sodium (COLACE) 50 MG capsule Take 1 capsule (50 mg total) by mouth 2 (two) times daily. 10/21/15  Yes Jaynee Eagles, PA-C  ferrous sulfate 325 (65 FE) MG EC tablet Take 1 tablet (325 mg total) by mouth 3 (three) times daily with meals. 10/21/15  Yes Jaynee Eagles, PA-C    No Known Allergies  Patient Active Problem List   Diagnosis Date Noted  . Dyspareunia in female 11/07/2015  . 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 Anemia   . Pre-diabetes     Past Surgical History:  Procedure Laterality Date  . CESAREAN SECTION     X3  . TUBAL LIGATION      Social History   Social History  . Marital status: Single    Spouse name: N/A  . Number of children: 3  . Years of education: [employer]   Occupational History  . house cleaner    Social History Main Topics  . Smoking status: Never Smoker  . Smokeless tobacco: Never Used  . Alcohol use 0.0 oz/week     Comment: SOCIAL  . Drug use: No  . Sexual activity: Yes    Birth control/ protection: Other-see comments, Surgical     Comment: tubal ligation   Other Topics Concern  . Not on file   Social History Narrative   Lives with Husband, 1 son and 3 daughters      From [place]    Family History  Problem Relation Age of Onset  . Cancer Mother   . Diabetes Paternal Grandmother      Review of Systems  Constitutional: Negative for chills and fever.  HENT: Negative for sore throat.   Eyes: Negative for discharge and redness.  Respiratory: Negative for cough and shortness of breath.   Gastrointestinal: Negative for abdominal pain, diarrhea, nausea and vomiting.  Genitourinary: Negative for dysuria, flank pain and hematuria.       +vaginal burning pain and discharge.  Musculoskeletal: Negative for joint pain.  Skin: Negative for rash.  All other systems reviewed and are negative.  Vitals:   07/04/16 1042  BP: 112/76  Pulse: 70  Resp: 16  Temp: 98.4 F (36.9 C)    Physical Exam  Constitutional: She is oriented to person, place, and time. She appears well-developed and well-nourished.  HENT:  Head: Normocephalic.  Eyes: EOM are normal. Pupils are equal, round, and reactive to light.  Neck: Normal range of motion. Neck supple.  Cardiovascular: Normal rate and regular rhythm.   Pulmonary/Chest: Effort normal and breath sounds normal.  Abdominal: Soft. Bowel sounds are normal. She exhibits no distension. There is no tenderness. Hernia confirmed negative in the right  inguinal area and confirmed negative in the left inguinal area.  Genitourinary: There is no rash on the right labia. There is no rash on the left labia. Cervix exhibits no motion tenderness and no discharge. There is erythema in the vagina. No tenderness or bleeding in the vagina. Vaginal discharge (whitish) found.  Lymphadenopathy:       Right: No inguinal adenopathy present.       Left: No inguinal adenopathy present.  Neurological: She is alert and oriented to person, place, and time.  Skin: Skin is warm and dry. Capillary refill takes less than 2 seconds. No rash noted.  Psychiatric: She has a normal mood and affect. Her behavior  is normal.  Vitals reviewed.    ASSESSMENT & PLAN: Sarah Guerrero was seen today for vaginal discharge.  Diagnoses and all orders for this visit:  Yeast vaginitis -     Ambulatory referral to Gynecology  Vaginal burning -     Ambulatory referral to Gynecology  Dyspareunia in female -     Ambulatory referral to Gynecology  Other orders -     fluconazole (DIFLUCAN) 200 MG tablet; Take 1 tablet (200 mg total) by mouth daily.    Patient Instructions       IF you received an x-ray today, you will receive an invoice from [facility]. Please contact [facility] at [phone] with questions or concerns regarding your invoice.   IF you received labwork today, you will receive an invoice from [facility]. Please contact [facility] at [phone] with questions or concerns regarding your invoice.   Our billing staff will not be able to assist you with questions regarding bills from these companies.  You will be contacted with the lab results as soon as they are available. The fastest way to get your results is to activate your My Chart account. Instructions are located on the last page of this paperwork. If you have not heard from [place] regarding the results in 2 weeks, please contact this office.     Candidiasis vaginal en los adultos (Gastrointestinal Yeast Infection, Adult) La candidiasis vaginal es una afeccin que causa dolor, hinchazn y enrojecimiento (inflamacin) de la vagina. Tambin causa secrecin vaginal. Esta es una enfermedad frecuente. Algunas mujeres contraen esta infeccin con frecuencia. CAUSAS La causa de la infeccin es un cambio en el equilibrio normal de los hongos (cndida) y las bacterias que viven en la vagina. Esta alteracin deriva en el crecimiento excesivo de los hongos, lo que causa la inflamacin. FACTORES DE RIESGO Es ms probable que esta afeccin se manifieste en:  Las mujeres que toman antibiticos.  Las mujeres que tienen  diabetes.  Las mujeres que toman anticonceptivos.  Las mujeres que estn embarazadas.  Las mujeres que se hacen duchas vaginales con frecuencia.  Las mujeres que tienen un sistema de defensa (inmunitario) dbil.  Las mujeres que han tomado corticoides durante mucho tiempo.  Las mujeres que usan ropa ajustada con frecuencia. SNTOMAS Los sntomas de esta afeccin incluyen lo siguiente:  Secrecin vaginal blanca y espesa.  Hinchazn, picazn, enrojecimiento e irritacin de la vagina. Los labios de la vagina (vulva) tambin se pueden infectar.  Dolor o ardor al Garment/textile technologist.  [place]. DIAGNSTICO Esta afeccin se diagnostica mediante la historia clnica y un examen fsico. Este incluye un examen plvico. El mdico examinar una muestra de la secrecin vaginal con un microscopio. Probablemente el mdico enve esta muestra al laboratorio para analizarla y confirmar el diagnstico. TRATAMIENTO Esta afeccin se trata con  medicamentos. Los Dynegy pueden ser recetados o de venta libre. Podrn indicarle que use uno o ms de lo siguiente:  Medicamentos por va oral.  Medicamentos que se aplican como una crema.  Medicamentos que se colocan directamente en la vagina (vulos vaginales). [place] o aplquese los medicamentos de venta libre y Editor, commissioning como se lo haya indicado el mdico.  No tenga relaciones sexuales hasta que el mdico lo autorice. Comunique a su compaero sexual que tiene una infeccin por hongos. Esas personas deben consultar al mdico si tienen sntomas.  No use ropa ajustada, como pantis o pantalones ajustados.  Evite el uso de tampones hasta que el mdico lo autorice.  Consuma ms yogur. Esto puede ayudar a Technical brewer de la candidiasis.  Intente darse un bao de asiento para [employer]. Se trata de un bao de agua tibia que se toma mientras se est sentado. El agua  solo debe Systems analyst las caderas y cubrir las nalgas. Hgalo 3o 4veces al da o como se lo haya indicado el mdico.  No se haga duchas vaginales.  Use ropa interior transpirable de algodn.  Si tiene diabetes, mantenga bajo control los niveles de Dispensing optician. SOLICITE ATENCIN MDICA SI:  Jaclynn Guarneri.  Los sntomas desaparecen y Teacher, adult education.  Los sntomas no mejoran con Dispensing optician.  Los sntomas empeoran.  Aparecen nuevos sntomas.  Aparecen ampollas alrededor o adentro de la vagina.  Le sale sangre de la vagina y no est menstruando.  Siente dolor en el abdomen. Esta informacin no tiene Marine scientist el consejo del mdico. Asegrese de hacerle al mdico cualquier pregunta que tenga. Document Released: 01/23/2005 Document Revised: 08/07/2015 Document Reviewed: 10/17/2014 Elsevier Interactive Patient Education  2017 Elsevier Inc.      Agustina Caroli, MD [facility]

## 2016-07-12 ENCOUNTER — Ambulatory Visit: Payer: Self-pay | Admitting: Gynecology

## 2016-07-12 DIAGNOSIS — Z0289 Encounter for other administrative examinations: Secondary | ICD-10-CM

## 2016-09-11 ENCOUNTER — Encounter: Payer: Self-pay | Admitting: Gynecology

## 2017-03-03 ENCOUNTER — Encounter: Payer: Self-pay | Admitting: Obstetrics & Gynecology

## 2017-03-10 ENCOUNTER — Encounter: Payer: Self-pay | Admitting: Obstetrics & Gynecology

## 2017-03-25 ENCOUNTER — Encounter (HOSPITAL_COMMUNITY): Payer: Self-pay | Admitting: *Deleted

## 2017-03-25 NOTE — Progress Notes (Signed)
Letter mailed to patient to schedule annual mammogram.

## 2017-03-26 ENCOUNTER — Ambulatory Visit (INDEPENDENT_AMBULATORY_CARE_PROVIDER_SITE_OTHER): Payer: Self-pay | Admitting: Obstetrics & Gynecology

## 2017-03-26 ENCOUNTER — Encounter: Payer: Self-pay | Admitting: Obstetrics & Gynecology

## 2017-03-26 VITALS — BP 126/84 | Ht 62.0 in | Wt 146.0 lb

## 2017-03-26 DIAGNOSIS — B3731 Acute candidiasis of vulva and vagina: Secondary | ICD-10-CM

## 2017-03-26 DIAGNOSIS — N898 Other specified noninflammatory disorders of vagina: Secondary | ICD-10-CM

## 2017-03-26 DIAGNOSIS — B9689 Other specified bacterial agents as the cause of diseases classified elsewhere: Secondary | ICD-10-CM

## 2017-03-26 DIAGNOSIS — Z113 Encounter for screening for infections with a predominantly sexual mode of transmission: Secondary | ICD-10-CM

## 2017-03-26 DIAGNOSIS — B373 Candidiasis of vulva and vagina: Secondary | ICD-10-CM

## 2017-03-26 DIAGNOSIS — N760 Acute vaginitis: Secondary | ICD-10-CM

## 2017-03-26 DIAGNOSIS — F5231 Female orgasmic disorder: Secondary | ICD-10-CM

## 2017-03-26 LAB — WET PREP FOR TRICH, YEAST, CLUE

## 2017-03-26 MED ORDER — TINIDAZOLE 500 MG PO TABS: 2.0000 g | ORAL_TABLET | Freq: Every day | ORAL | 2 refills | Status: AC

## 2017-03-26 MED ORDER — FLUCONAZOLE 150 MG PO TABS: 150.0000 mg | ORAL_TABLET | Freq: Every day | ORAL | 2 refills | Status: AC

## 2017-03-26 NOTE — Progress Notes (Signed)
    Sarah Guerrero December 10, 1975 [id number]        41 y.o.  G3P3 Married. Status post Tubal Ligation.  Daughter present at visit.  RP:  Persistent vaginal discharge with odor   HPI:  Increased vaginal discharge x many years..  Currently has an increased vaginal discharge with odor  Pain with IC.  No orgasm with IC, but reaches orgasm with masturbation.  No pelvic pain.  Menses regular and normal every month.  Urine and bowel movements normal.   Past medical history,surgical history, problem list, medications, allergies, family history and social history were all reviewed and documented in the EPIC chart.  Directed ROS with pertinent positives and negatives documented in the history of present illness/assessment and plan.  Exam:  Vitals:   03/26/17 0820  BP: 126/84  Weight: 146 lb (66.2 kg)  Height: 5\' 2"  (1.575 m)   General appearance:  Normal  Gyn exam:  Vulva normal.  Speculum:     Assessment/Plan:  41 y.o. G3P3   1. Vaginal discharge Wet prep showing bacterial vaginosi and yeast vaginitis.  Diagnosis discussed with patient.  Will treat with tinidazole first 4 tablets daily for 2 days and then with fluconazole 1 tablet daily for 3 days.  Usage reviewed.  Recommend probiotic tablet vaginally every week to improve her vaginal flora and prevent recurrence of bacterial vaginosis and yeast vaginitis. - WET PREP FOR TRICH, YEAST, CLUE  2. Vaginal odor As above. - WET PREP FOR [place], YEAST, CLUE  3. Screening examination for venereal disease Given dyspareunia, rule out cervicitis from chlamydia or gonorrhea. - C. trachomatis/N. gonorrhoeae RNA  4. Bacterial vaginosis As above  5. 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  Counseling done on foreplay, clitoral stimulation and active participation during intercourse.   counseling on above issues more than 50% for 25 minutes  Sarah Bruins MD, 8:31 AM  03/26/2017

## 2017-03-27 LAB — C. TRACHOMATIS/N. GONORRHOEAE RNA
C. trachomatis RNA, TMA: NOT DETECTED
N. GONORRHOEAE RNA, TMA: NOT DETECTED

## 2017-03-29 NOTE — Patient Instructions (Signed)
1. Vaginal discharge Wet prep showing bacterial vaginosi and yeast vaginitis.  Diagnosis discussed with patient.  Will treat with tinidazole first 4 tablets daily for 2 days and then with fluconazole 1 tablet daily for 3 days.  Usage reviewed.  Recommend probiotic tablet vaginally every week to improve her vaginal flora and prevent recurrence of bacterial vaginosis and yeast vaginitis. - WET PREP FOR TRICH, YEAST, CLUE  2. Vaginal odor As above. - WET PREP FOR [place], YEAST, CLUE  3. Screening examination for venereal disease Given dyspareunia, rule out cervicitis from chlamydia or gonorrhea. - C. trachomatis/N. gonorrhoeae RNA  4. Bacterial vaginosis As above  5. 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  Counseling done on foreplay, clitoral stimulation and active participation during intercourse.  Verdis [place], [place] un placer encontrarla hoy!  Voy a informarla de Financial trader.   Vaginosis bacteriana (Bacterial Vaginosis) La vaginosis bacteriana es una infeccin vaginal que perturba el equilibrio normal de las bacterias que se encuentran en la vagina. Es el resultado de un crecimiento excesivo de ciertas bacterias. Esta es la infeccin vaginal ms frecuente en mujeres en edad reproductiva. El tratamiento es importante para prevenir complicaciones, especialmente en mujeres embarazadas, dado que puede causar un parto prematuro. CAUSAS La vaginosis bacteriana se origina por un aumento de bacterias nocivas que, generalmente, estn presentes en cantidades ms pequeas en la vagina. Varios tipos diferentes de bacterias pueden causar esta afeccin. Sin embargo, la causa de su desarrollo no se comprende totalmente. [place] o comportamientos pueden exponerlo a un mayor riesgo de desarrollar vaginosis bacteriana, entre los que se incluyen:  Tener una nueva pareja sexual o mltiples parejas sexuales.  Las duchas  vaginales  El uso del DIU (dispositivo intrauterino) como mtodo anticonceptivo. El contagio no se produce en baos, por ropas de cama, en piscinas o por contacto con objetos. SIGNOS Y SNTOMAS Algunas mujeres que padecen vaginosis bacteriana no presentan signos ni sntomas. Los sntomas ms comunes son:  Secrecin vaginal de color grisceo.  Secrecin vaginal con olor similar al [employer], especialmente despus de Retail banker.  Picazn o sensacin de ardor en la vagina o la vulva.  Ardor o dolor al [employer]. DIAGNSTICO Su mdico analizar su historia clnica y le examinar la vagina para detectar signos de vaginosis bacteriana. Puede tomarle Truddie Coco de flujo vaginal. Su mdico examinar esta muestra con un microscopio para controlar las bacterias y clulas anormales. Tambin puede realizarse un anlisis del pH vaginal. TRATAMIENTO La vaginosis bacteriana puede tratarse con antibiticos, en forma de comprimidos o de crema vaginal. Puede indicarse una segunda tanda de antibiticos si la afeccin se repite despus del tratamiento. Debido a que la vaginosis bacteriana aumenta el riesgo de contraer enfermedades de transmisin sexual, el tratamiento puede ayudar a reducir el riesgo de clamidia, [place], VIH y herpes. Conger solo medicamentos de venta libre o recetados, segn las indicaciones del mdico.  Si le han recetado antibiticos, tmelos como se le indic. Asegrese de que finaliza la prescripcin completa aunque se sienta mejor.  Comunique a sus compaeros sexuales que sufre una infeccin vaginal. Deben consultar a su mdico y recibir tratamiento si tienen problemas, como picazn o una erupcin cutnea leve.  Durante el [place], es importante que siga estas indicaciones: ? Teacher, music o use preservativos de Cabin crew. ? No se haga duchas vaginales. ? Evite consumir alcohol como se lo haya  indicado el mdico. ? Evite amamantar como se lo haya indicado el mdico.  SOLICITE ATENCIN MDICA SI:  Sus sntomas no mejoran despus de 3 das de [place].  Aumenta la secrecin o Conservation officer, historic buildings.  Tiene fiebre.  ASEGRESE DE QUE:  Comprende estas instrucciones.  Controlar su afeccin.  Recibir ayuda de inmediato si no mejora o si empeora.  PARA OBTENER MS INFORMACIN Centros para el control y la prevencin de Probation officer for Disease Control and Prevention, CDC): AppraiserFraud.fi Asociacin Estadounidense de la Salud Sexual (American Sexual Health Association, SHA): www.ashastd.org Esta informacin no tiene Marine scientist el consejo del mdico. Asegrese de hacerle al mdico cualquier pregunta que tenga. Document Released: 07/23/2007 Document Revised: 05/06/2014 Document Reviewed: 11/25/2012 Elsevier Interactive Patient Education  2017 Elsevier Inc.  Candidiasis vaginal en los adultos (Gastrointestinal Yeast Infection, Adult) La candidiasis vaginal es una afeccin que causa dolor, hinchazn y enrojecimiento (inflamacin) de la vagina. Tambin causa secrecin vaginal. Esta es una enfermedad frecuente. Algunas mujeres contraen esta infeccin con frecuencia. CAUSAS La causa de la infeccin es un cambio en el equilibrio normal de los hongos (cndida) y las bacterias que viven en la vagina. Esta alteracin deriva en el crecimiento excesivo de los hongos, lo que causa la inflamacin. FACTORES DE RIESGO Es ms probable que esta afeccin se manifieste en:  Las mujeres que toman antibiticos.  Las mujeres que tienen diabetes.  Las mujeres que toman anticonceptivos.  Las mujeres que estn embarazadas.  Las mujeres que se hacen duchas vaginales con frecuencia.  Las mujeres que tienen un sistema de defensa (inmunitario) dbil.  Las mujeres que han tomado corticoides durante mucho tiempo.  Las mujeres que usan ropa ajustada con frecuencia. SNTOMAS Los sntomas  de esta afeccin incluyen lo siguiente:  Secrecin vaginal blanca y espesa.  Hinchazn, picazn, enrojecimiento e irritacin de la vagina. Los labios de la vagina (vulva) tambin se pueden infectar.  Dolor o ardor al Garment/textile technologist.  [place]. DIAGNSTICO Esta afeccin se diagnostica mediante la historia clnica y un examen fsico. Este incluye un examen plvico. El mdico examinar una muestra de la secrecin vaginal con un microscopio. Probablemente el mdico enve esta muestra al laboratorio para analizarla y confirmar el diagnstico. TRATAMIENTO Esta afeccin se trata con medicamentos. Los Dynegy pueden ser recetados o de venta libre. Podrn indicarle que use uno o ms de lo siguiente:  Medicamentos por va oral.  Medicamentos que se aplican como una crema.  Medicamentos que se colocan directamente en la vagina (vulos vaginales). [place] o aplquese los medicamentos de venta libre y Editor, commissioning como se lo haya indicado el mdico.  No tenga relaciones sexuales hasta que el mdico lo autorice. Comunique a su compaero sexual que tiene una infeccin por hongos. Esas personas deben consultar al mdico si tienen sntomas.  No use ropa ajustada, como pantis o pantalones ajustados.  Evite el uso de tampones hasta que el mdico lo autorice.  Consuma ms yogur. Esto puede ayudar a Technical brewer de la candidiasis.  Intente darse un bao de asiento para [employer]. Se trata de un bao de agua tibia que se toma mientras se est sentado. El agua solo debe Systems analyst las caderas y cubrir las nalgas. Hgalo 3o 4veces al da o como se lo haya indicado el mdico.  No se haga duchas vaginales.  Use ropa interior transpirable de algodn.  Si tiene diabetes, mantenga bajo control los niveles de Location manager  en la sangre. SOLICITE ATENCIN MDICA SI:  Jaclynn Guarneri.  Los sntomas desaparecen y Radiographer, therapeutic.  Los sntomas no mejoran con Dispensing optician.  Los sntomas empeoran.  Aparecen nuevos sntomas.  Aparecen ampollas alrededor o adentro de la vagina.  Le sale sangre de la vagina y no est menstruando.  Siente dolor en el abdomen. Esta informacin no tiene Marine scientist el consejo del mdico. Asegrese de hacerle al mdico cualquier pregunta que tenga. Document Released: 01/23/2005 Document Revised: 08/07/2015 Document Reviewed: 10/17/2014 Elsevier Interactive Patient Education  [employer].

## 2017-12-01 ENCOUNTER — Other Ambulatory Visit: Payer: Self-pay

## 2017-12-01 ENCOUNTER — Ambulatory Visit (INDEPENDENT_AMBULATORY_CARE_PROVIDER_SITE_OTHER): Payer: Self-pay | Admitting: Emergency Medicine

## 2017-12-01 ENCOUNTER — Encounter: Payer: Self-pay | Admitting: Emergency Medicine

## 2017-12-01 VITALS — BP 111/68 | HR 77 | Temp 98.7°F | Resp 16 | Ht 61.5 in | Wt 141.6 lb

## 2017-12-01 DIAGNOSIS — N898 Other specified noninflammatory disorders of vagina: Secondary | ICD-10-CM | POA: Insufficient documentation

## 2017-12-01 DIAGNOSIS — R350 Frequency of micturition: Secondary | ICD-10-CM | POA: Insufficient documentation

## 2017-12-01 DIAGNOSIS — B3731 Acute candidiasis of vulva and vagina: Secondary | ICD-10-CM

## 2017-12-01 DIAGNOSIS — B9689 Other specified bacterial agents as the cause of diseases classified elsewhere: Secondary | ICD-10-CM

## 2017-12-01 DIAGNOSIS — B373 Candidiasis of vulva and vagina: Secondary | ICD-10-CM

## 2017-12-01 DIAGNOSIS — N760 Acute vaginitis: Secondary | ICD-10-CM | POA: Insufficient documentation

## 2017-12-01 LAB — POCT URINE PREGNANCY: PREG TEST UR: NEGATIVE

## 2017-12-01 LAB — POCT URINALYSIS DIP (MANUAL ENTRY)
BILIRUBIN UA: NEGATIVE mg/dL
Bilirubin, UA: NEGATIVE
GLUCOSE UA: NEGATIVE mg/dL
Nitrite, UA: NEGATIVE
Protein Ur, POC: NEGATIVE mg/dL
Urobilinogen, UA: 0.2 E.U./dL
pH, UA: 6.5 (ref 5.0–8.0)

## 2017-12-01 LAB — POCT WET + KOH PREP: Trich by wet prep: ABSENT

## 2017-12-01 MED ORDER — TINIDAZOLE 500 MG PO TABS: 2.0000 g | ORAL_TABLET | Freq: Every day | ORAL | 0 refills | Status: AC

## 2017-12-01 MED ORDER — FLUCONAZOLE 150 MG PO TABS: ORAL_TABLET | ORAL | 1 refills | Status: DC

## 2017-12-01 NOTE — Progress Notes (Signed)
Sarah Guerrero Socorro Gonzalez-Hernandez 42 y.o.   Chief Complaint  Patient presents with  . Urinary Frequency    x 1 week and burning at the end, vaginal odor brown color x 2 years    HISTORY OF PRESENT ILLNESS: This is a 42 y.o. female complaining of recurrent vaginal infection for the past 2 years.  Has a burning sensation inside the vagina.  Very painful intercourse.  Sexually active with husband.  Saw gynecologist last November and was diagnosed with both yeast vaginitis and bacterial vaginosis.  Was treated.  Temporarily better.  Symptoms came back soon after.  Symptoms have been chronic, waxing and waning, for the past 1 to 2 years.  No history of diabetes but desires to be tested.  Has persistent brownish discharge.  HPI   Prior to Admission medications   Medication Sig Start Date End Date Taking? Authorizing Provider  multivitamin-iron-minerals-folic acid (CENTRUM) chewable tablet Chew 1 tablet by mouth daily.   Yes [provider]  UNABLE TO [place]   Yes [provider]    No Known Allergies  Patient Active Problem List   Diagnosis Date Noted  . Yeast vaginitis 07/04/2016  . Dyspareunia in female 11/07/2015  . 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 Anemia   . Pre-diabetes     Past Surgical History:  Procedure Laterality Date  . CESAREAN SECTION     X3  . TUBAL LIGATION      Social History   Socioeconomic History  . Marital status: Single    Spouse name: Not on file  . Number of children: 3  . Years of education: [place] [employer]  . Highest education level: Not on file  Occupational History  . Occupation: Electrical engineer  Social Needs  . Financial resource strain: Not on file  . Food insecurity:    Worry: Not on file    Inability: Not on file  . Transportation needs:    Medical: Not on file    Non-medical: Not on file  Tobacco Use  . Smoking status: Never Smoker  . Smokeless tobacco: Never Used    Substance and Sexual Activity  . Alcohol use: Yes    Alcohol/week: 0.0 oz    Comment: SOCIAL  . Drug use: No  . Sexual activity: Yes    Partners: Male    Birth control/protection: Other-see comments, Surgical    Comment: 1st intercourse- 42, married - 71 yrs   Lifestyle  . Physical activity:    Days per week: Not on file    Minutes per session: Not on file  . Stress: Not on file  Relationships  . Social connections:    Talks on phone: Not on file    Gets together: Not on file    Attends religious service: Not on file    Active member of club or organization: Not on file    Attends meetings of clubs or organizations: Not on file    Relationship status: Not on file  . Intimate partner violence:    Fear of current or ex partner: Not on file    Emotionally abused: Not on file    Physically abused: Not on file    Forced sexual activity: Not on file  Other Topics Concern  . Not on file  Social History Narrative   Lives with Husband, 1 son and 3 daughters      From [place]    Family History  Problem  Relation Age of Onset  . Cancer Mother   . Diabetes Paternal Grandmother      Review of Systems  Constitutional: Negative.  Negative for chills, fever and weight loss.  HENT: Negative for sore throat.   Eyes: Negative.   Respiratory: Negative.  Negative for cough and shortness of breath.   Cardiovascular: Negative.  Negative for chest pain and palpitations.  Gastrointestinal: Negative for nausea and vomiting.  Genitourinary: Positive for dysuria. Negative for hematuria.       Vaginal discharge  Skin: Negative.  Negative for rash.  Neurological: Negative.  Negative for dizziness and headaches.  Endo/Heme/Allergies: Negative.   All other systems reviewed and are negative.   Vitals:   12/01/17 1710  BP: 111/68  Pulse: 77  Resp: 16  Temp: 98.7 F (37.1 C)  SpO2: 100%    Physical Exam  Constitutional: She is oriented to person, place, and time. She appears  well-developed and well-nourished.  HENT:  Head: Normocephalic and atraumatic.  Nose: Nose normal.  Mouth/Throat: Oropharynx is clear and moist.  Eyes: Pupils are equal, round, and reactive to light. Conjunctivae and EOM are normal.  Neck: Normal range of motion. Neck supple.  Cardiovascular: Normal rate and regular rhythm.  Pulmonary/Chest: Effort normal and breath sounds normal.  Abdominal: Soft. Bowel sounds are normal. She exhibits no distension. There is no tenderness. Hernia confirmed negative in the right inguinal area and confirmed negative in the left inguinal area.  Genitourinary: There is no rash or tenderness on the right labia. There is no rash or tenderness on the left labia. Cervix exhibits no motion tenderness, no discharge and no friability. There is erythema and tenderness in the vagina. Vaginal discharge found.  Musculoskeletal: Normal range of motion.  Lymphadenopathy: No inguinal adenopathy noted on the left side.  Neurological: She is alert and oriented to person, place, and time. No sensory deficit. She exhibits normal muscle tone.  Skin: Skin is warm and dry. Capillary refill takes less than 2 seconds.  Psychiatric: She has a normal mood and affect.  Vitals reviewed.     Results for orders placed or performed in visit on 12/01/17 (from the past 24 hour(s))  POCT urinalysis dipstick     Status: Abnormal   Collection Time: 12/01/17  5:21 PM  Result Value Ref Range   Color, UA yellow yellow   Clarity, UA clear clear   Glucose, UA negative negative mg/dL   Bilirubin, UA negative negative   Ketones, POC UA negative negative mg/dL   Spec Grav, UA <=1.005 (A) 1.010 - 1.025   Blood, UA trace-intact (A) negative   pH, UA 6.5 5.0 - 8.0   Protein Ur, POC negative negative mg/dL   Urobilinogen, UA 0.2 0.2 or 1.0 E.U./dL   Nitrite, UA Negative Negative   Leukocytes, UA Small (1+) (A) Negative  POCT urine pregnancy     Status: None   Collection Time: 12/01/17  5:24 PM    Result Value Ref Range   Preg Test, Ur Negative Negative  POCT Wet + KOH Prep     Status: Abnormal   Collection Time: 12/01/17  6:20 PM  Result Value Ref Range   Yeast by KOH Present (A) Absent   Yeast by wet prep Present (A) Absent   WBC by wet prep Moderate (A) Few   Clue Cells Wet Prep HPF POC Moderate (A) None   Trich by wet prep Absent Absent   Bacteria Wet Prep HPF POC Few Few   Epithelial  Cells By [employer] (UMFC) Moderate (A) None, Few, Too numerous to count   RBC,UR,HPF,POC None None RBC/hpf    ASSESSMENT & PLAN: Yousra was seen today for urinary frequency.  Diagnoses and all orders for this visit:  Vaginal discharge -     POCT urinalysis dipstick -     POCT urine pregnancy -     POCT Wet + KOH Prep  Urinary frequency -     POCT urinalysis dipstick -     POCT urine pregnancy  Yeast vaginitis -     CBC with Differential/Platelet -     Comprehensive metabolic panel -     Hemoglobin A1c  Bacterial vaginosis  Other orders -     tinidazole (TINDAMAX) 500 MG tablet; Take 4 tablets (2,000 mg total) by mouth daily with breakfast for 2 days. -     fluconazole (DIFLUCAN) 150 MG tablet; Sig one (1) tablet daily for 3 days.    Patient Instructions       IF you received an x-ray today, you will receive an invoice from [facility] Radiology. Please contact [facility] Radiology at [phone] with questions or concerns regarding your invoice.   IF you received labwork today, you will receive an invoice from [facility]. Please contact [facility] at [phone] with questions or concerns regarding your invoice.   Our billing staff will not be able to assist you with questions regarding bills from these companies.  You will be contacted with the lab results as soon as they are available. The fastest way to get your results is to activate your My Chart account. Instructions are located on the last page of this paperwork. If you have not heard from [place] regarding the results in  2 weeks, please contact this office.     Vaginosis bacteriana (Bacterial Vaginosis) La vaginosis bacteriana es una infeccin de la vagina. Se produce cuando crece una cantidad excesiva de grmenes normales (bacterias sanas) en la vagina. Esta infeccin aumenta el riesgo de contraer otras infecciones de transmisin sexual. El tratamiento de esta infeccin puede ayudar a reducir el riesgo de otras infecciones, como:  Clamidia.  Roderick Pee.  VIH.  Herpes. [place] los medicamentos tal como se lo indic su mdico.  Finalice la prescripcin completa, aunque comience a sentirse mejor.  Comunique a sus compaeros sexuales que sufre una infeccin. Deben consultar a su mdico para iniciar un tratamiento.  Durante el tratamiento: ? Teacher, music o use preservativos de Cabin crew. ? No se haga duchas vaginales. ? No consuma alcohol a menos que el mdico lo autorice. ? No amamante a menos que el mdico la autorice.  SOLICITE AYUDA SI:  No mejora luego de 3 das de tratamiento.  Observa una secrecin (prdida) de color gris ms abundante que proviene de la vagina.  Siente ms dolor que antes.  Tiene fiebre.  ASEGRESE DE QUE:  Comprende estas instrucciones.  Controlar su afeccin.  Recibir ayuda de inmediato si no mejora o si empeora.  Esta informacin no tiene Marine scientist el consejo del mdico. Asegrese de hacerle al mdico cualquier pregunta que tenga. Document Released: 07/12/2008 Document Revised: 08/07/2015 Document Reviewed: 11/25/2012 Elsevier Interactive Patient Education  2017 Elsevier Inc.  Candidiasis vaginal en los adultos (Gastrointestinal Yeast Infection, Adult) La candidiasis vaginal es una afeccin que causa dolor, hinchazn y enrojecimiento (inflamacin) de la vagina. Tambin causa secrecin vaginal. Esta es una enfermedad frecuente. Algunas mujeres contraen esta infeccin con frecuencia. [place]  de la infeccin es un cambio en el equilibrio normal de los hongos (cndida) y las bacterias que viven en la vagina. Esta alteracin deriva en el crecimiento excesivo de los hongos, lo que causa la inflamacin. FACTORES DE RIESGO Es ms probable que esta afeccin se manifieste en:  Las mujeres que toman antibiticos.  Las mujeres que tienen diabetes.  Las mujeres que toman anticonceptivos.  Las mujeres que estn embarazadas.  Las mujeres que se hacen duchas vaginales con frecuencia.  Las mujeres que tienen un sistema de defensa (inmunitario) dbil.  Las mujeres que han tomado corticoides durante mucho tiempo.  Las mujeres que usan ropa ajustada con frecuencia. SNTOMAS Los sntomas de esta afeccin incluyen lo siguiente:  Secrecin vaginal blanca y espesa.  Hinchazn, picazn, enrojecimiento e irritacin de la vagina. Los labios de la vagina (vulva) tambin se pueden infectar.  Dolor o ardor al Garment/textile technologist.  [place]. DIAGNSTICO Esta afeccin se diagnostica mediante la historia clnica y un examen fsico. Este incluye un examen plvico. El mdico examinar una muestra de la secrecin vaginal con un microscopio. Probablemente el mdico enve esta muestra al laboratorio para analizarla y confirmar el diagnstico. TRATAMIENTO Esta afeccin se trata con medicamentos. Los Dynegy pueden ser recetados o de venta libre. Podrn indicarle que use uno o ms de lo siguiente:  Medicamentos por va oral.  Medicamentos que se aplican como una crema.  Medicamentos que se colocan directamente en la vagina (vulos vaginales). [place] o aplquese los medicamentos de venta libre y Editor, commissioning como se lo haya indicado el mdico.  No tenga relaciones sexuales hasta que el mdico lo autorice. Comunique a su compaero sexual que tiene una infeccin por hongos. Esas personas deben consultar al mdico si tienen  sntomas.  No use ropa ajustada, como pantis o pantalones ajustados.  Evite el uso de tampones hasta que el mdico lo autorice.  Consuma ms yogur. Esto puede ayudar a Technical brewer de la candidiasis.  Intente darse un bao de asiento para [employer]. Se trata de un bao de agua tibia que se toma mientras se est sentado. El agua solo debe Systems analyst las caderas y cubrir las nalgas. Hgalo 3o 4veces al da o como se lo haya indicado el mdico.  No se haga duchas vaginales.  Use ropa interior transpirable de algodn.  Si tiene diabetes, mantenga bajo control los niveles de Dispensing optician. SOLICITE ATENCIN MDICA SI:  Jaclynn Guarneri.  Los sntomas desaparecen y Teacher, adult education.  Los sntomas no mejoran con Dispensing optician.  Los sntomas empeoran.  Aparecen nuevos sntomas.  Aparecen ampollas alrededor o adentro de la vagina.  Le sale sangre de la vagina y no est menstruando.  Siente dolor en el abdomen. Esta informacin no tiene Marine scientist el consejo del mdico. Asegrese de hacerle al mdico cualquier pregunta que tenga. Document Released: 01/23/2005 Document Revised: 08/07/2015 Document Reviewed: 10/17/2014 Elsevier Interactive Patient Education  2018 Elsevier Inc.      Agustina Caroli, MD [facility]

## 2017-12-01 NOTE — Patient Instructions (Addendum)
IF you received an x-ray today, you will receive an invoice from [facility] Radiology. Please contact [facility] Radiology at [phone] with questions or concerns regarding your invoice.   IF you received labwork today, you will receive an invoice from [facility]. Please contact [facility] at [phone] with questions or concerns regarding your invoice.   Our billing staff will not be able to assist you with questions regarding bills from these companies.  You will be contacted with the lab results as soon as they are available. The fastest way to get your results is to activate your My Chart account. Instructions are located on the last page of this paperwork. If you have not heard from [place] regarding the results in 2 weeks, please contact this office.     Vaginosis bacteriana (Bacterial Vaginosis) La vaginosis bacteriana es una infeccin de la vagina. Se produce cuando crece una cantidad excesiva de grmenes normales (bacterias sanas) en la vagina. Esta infeccin aumenta el riesgo de contraer otras infecciones de transmisin sexual. El tratamiento de esta infeccin puede ayudar a reducir el riesgo de otras infecciones, como:  Clamidia.  Roderick Pee.  VIH.  Herpes. [place] los medicamentos tal como se lo indic su mdico.  Finalice la prescripcin completa, aunque comience a sentirse mejor.  Comunique a sus compaeros sexuales que sufre una infeccin. Deben consultar a su mdico para iniciar un tratamiento.  Durante el tratamiento: ? Teacher, music o use preservativos de Cabin crew. ? No se haga duchas vaginales. ? No consuma alcohol a menos que el mdico lo autorice. ? No amamante a menos que el mdico la autorice.  SOLICITE AYUDA SI:  No mejora luego de 3 das de tratamiento.  Observa una secrecin (prdida) de color gris ms abundante que proviene de la vagina.  Siente ms dolor que antes.  Tiene fiebre.  ASEGRESE DE  QUE:  Comprende estas instrucciones.  Controlar su afeccin.  Recibir ayuda de inmediato si no mejora o si empeora.  Esta informacin no tiene Marine scientist el consejo del mdico. Asegrese de hacerle al mdico cualquier pregunta que tenga. Document Released: 07/12/2008 Document Revised: 08/07/2015 Document Reviewed: 11/25/2012 Elsevier Interactive Patient Education  2017 Elsevier Inc.  Candidiasis vaginal en los adultos (Gastrointestinal Yeast Infection, Adult) La candidiasis vaginal es una afeccin que causa dolor, hinchazn y enrojecimiento (inflamacin) de la vagina. Tambin causa secrecin vaginal. Esta es una enfermedad frecuente. Algunas mujeres contraen esta infeccin con frecuencia. CAUSAS La causa de la infeccin es un cambio en el equilibrio normal de los hongos (cndida) y las bacterias que viven en la vagina. Esta alteracin deriva en el crecimiento excesivo de los hongos, lo que causa la inflamacin. FACTORES DE RIESGO Es ms probable que esta afeccin se manifieste en:  Las mujeres que toman antibiticos.  Las mujeres que tienen diabetes.  Las mujeres que toman anticonceptivos.  Las mujeres que estn embarazadas.  Las mujeres que se hacen duchas vaginales con frecuencia.  Las mujeres que tienen un sistema de defensa (inmunitario) dbil.  Las mujeres que han tomado corticoides durante mucho tiempo.  Las mujeres que usan ropa ajustada con frecuencia. SNTOMAS Los sntomas de esta afeccin incluyen lo siguiente:  Secrecin vaginal blanca y espesa.  Hinchazn, picazn, enrojecimiento e irritacin de la vagina. Los labios de la vagina (vulva) tambin se pueden infectar.  Dolor o ardor al Garment/textile technologist.  [place]. DIAGNSTICO Esta afeccin se diagnostica mediante la historia clnica y un examen  fsico. Este incluye un examen plvico. El mdico examinar Truddie Coco de la secrecin vaginal con un microscopio. Probablemente el mdico  enve esta muestra al laboratorio para analizarla y confirmar el diagnstico. TRATAMIENTO Esta afeccin se trata con medicamentos. Los Dynegy pueden ser recetados o de venta libre. Podrn indicarle que use uno o ms de lo siguiente:  Medicamentos por va oral.  Medicamentos que se aplican como una crema.  Medicamentos que se colocan directamente en la vagina (vulos vaginales). [place] o aplquese los medicamentos de venta libre y Editor, commissioning como se lo haya indicado el mdico.  No tenga relaciones sexuales hasta que el mdico lo autorice. Comunique a su compaero sexual que tiene una infeccin por hongos. Esas personas deben consultar al mdico si tienen sntomas.  No use ropa ajustada, como pantis o pantalones ajustados.  Evite el uso de tampones hasta que el mdico lo autorice.  Consuma ms yogur. Esto puede ayudar a Technical brewer de la candidiasis.  Intente darse un bao de asiento para [employer]. Se trata de un bao de agua tibia que se toma mientras se est sentado. El agua solo debe Systems analyst las caderas y cubrir las nalgas. Hgalo 3o 4veces al da o como se lo haya indicado el mdico.  No se haga duchas vaginales.  Use ropa interior transpirable de algodn.  Si tiene diabetes, mantenga bajo control los niveles de Dispensing optician. SOLICITE ATENCIN MDICA SI:  Jaclynn Guarneri.  Los sntomas desaparecen y Teacher, adult education.  Los sntomas no mejoran con Dispensing optician.  Los sntomas empeoran.  Aparecen nuevos sntomas.  Aparecen ampollas alrededor o adentro de la vagina.  Le sale sangre de la vagina y no est menstruando.  Siente dolor en el abdomen. Esta informacin no tiene Marine scientist el consejo del mdico. Asegrese de hacerle al mdico cualquier pregunta que tenga. Document Released: 01/23/2005 Document Revised: 08/07/2015 Document Reviewed: 10/17/2014 Elsevier Interactive  Patient Education  [employer].

## 2017-12-02 ENCOUNTER — Telehealth: Payer: Self-pay | Admitting: Emergency Medicine

## 2017-12-02 LAB — COMPREHENSIVE METABOLIC PANEL
ALBUMIN: 4.4 g/dL (ref 3.5–5.5)
ALK PHOS: 57 IU/L (ref 39–117)
ALT: 19 IU/L (ref 0–32)
AST: 19 IU/L (ref 0–40)
Albumin/Globulin Ratio: 1.3 (ref 1.2–2.2)
BUN/Creatinine Ratio: 15 (ref 9–23)
BUN: 9 mg/dL (ref 6–24)
CHLORIDE: 104 mmol/L (ref 96–106)
CO2: 22 mmol/L (ref 20–29)
CREATININE: 0.62 mg/dL (ref 0.57–1.00)
Calcium: 8.8 mg/dL (ref 8.7–10.2)
GFR calc non Af Amer: 110 mL/min/{1.73_m2} (ref >59)
GFR, EST AFRICAN AMERICAN: 127 mL/min/{1.73_m2} (ref >59)
GLUCOSE: 97 mg/dL (ref 65–99)
Globulin, Total: 3.4 g/dL (ref 1.5–4.5)
Potassium: 3.8 mmol/L (ref 3.5–5.2)
Sodium: 140 mmol/L (ref 134–144)
TOTAL PROTEIN: 7.8 g/dL (ref 6.0–8.5)

## 2017-12-02 LAB — CBC WITH DIFFERENTIAL/PLATELET
BASOS ABS: 0 10*3/uL (ref 0.0–0.2)
Basos: 0 %
EOS (ABSOLUTE): 0.1 10*3/uL (ref 0.0–0.4)
Eos: 1 %
HEMOGLOBIN: 9.7 g/dL — AB (ref 11.1–15.9)
Hematocrit: 31.7 % — ABNORMAL LOW (ref 34.0–46.6)
IMMATURE GRANS (ABS): 0 10*3/uL (ref 0.0–0.1)
Immature Granulocytes: 0 %
LYMPHS: 23 %
Lymphocytes Absolute: 1.6 10*3/uL (ref 0.7–3.1)
MCH: 22 pg — AB (ref 26.6–33.0)
MCHC: 30.6 g/dL — ABNORMAL LOW (ref 31.5–35.7)
MCV: 72 fL — ABNORMAL LOW (ref 79–97)
MONOCYTES: 8 %
Monocytes Absolute: 0.5 10*3/uL (ref 0.1–0.9)
NEUTROS ABS: 4.7 10*3/uL (ref 1.4–7.0)
Neutrophils: 68 %
PLATELETS: 372 10*3/uL (ref 150–450)
RBC: 4.41 x10E6/uL (ref 3.77–5.28)
RDW: 16.6 % — ABNORMAL HIGH (ref 12.3–15.4)
WBC: 7 10*3/uL (ref 3.4–10.8)

## 2017-12-02 LAB — HEMOGLOBIN A1C
Est. average glucose Bld gHb Est-mCnc: 105 mg/dL
HEMOGLOBIN A1C: 5.3 % (ref 4.8–5.6)

## 2017-12-02 NOTE — Telephone Encounter (Signed)
Spoke to patient about blood results.  Started medication yesterday.

## 2017-12-04 ENCOUNTER — Ambulatory Visit (INDEPENDENT_AMBULATORY_CARE_PROVIDER_SITE_OTHER): Payer: Self-pay | Admitting: Obstetrics & Gynecology

## 2017-12-04 ENCOUNTER — Encounter: Payer: Self-pay | Admitting: Obstetrics & Gynecology

## 2017-12-04 VITALS — BP 126/86

## 2017-12-04 DIAGNOSIS — D649 Anemia, unspecified: Secondary | ICD-10-CM

## 2017-12-04 DIAGNOSIS — R300 Dysuria: Secondary | ICD-10-CM

## 2017-12-04 DIAGNOSIS — Z113 Encounter for screening for infections with a predominantly sexual mode of transmission: Secondary | ICD-10-CM

## 2017-12-04 DIAGNOSIS — N898 Other specified noninflammatory disorders of vagina: Secondary | ICD-10-CM

## 2017-12-04 LAB — WET PREP FOR TRICH, YEAST, CLUE

## 2017-12-04 MED ORDER — TERCONAZOLE 0.8 % VA CREA: 1.0000 | TOPICAL_CREAM | Freq: Every day | VAGINAL | 1 refills | Status: AC

## 2017-12-04 MED ORDER — FLUCONAZOLE 150 MG PO TABS: 150.0000 mg | ORAL_TABLET | Freq: Once | ORAL | 2 refills | Status: AC

## 2017-12-04 MED ORDER — SULFAMETHOXAZOLE-TRIMETHOPRIM 800-160 MG PO TABS: 1.0000 | ORAL_TABLET | Freq: Two times a day (BID) | ORAL | 0 refills | Status: AC

## 2017-12-04 NOTE — Patient Instructions (Signed)
1. Vaginal discharge Wet prep negative, reassured. - WET PREP FOR [place], YEAST, CLUE  2. Dysuria Probable Cystitis.  Pending Urine Culture.  No allergy to Antibiotics.  Start Bactrim DS 1 tab twice a day x 3 days.  Prescription sent to pharmacy.  3. Screen for STD (sexually transmitted disease) R/O STI. - C. trachomatis/N. gonorrhoeae RNA  4. Anemia, unspecified type Denies heavy menses or rectal bleeding.  Hb 9.7 on 12/01/2017 (Hb 9.4 09/2015).  Recommend Iron-rich nutrition and Iron sulfate supplements.  Will draw Vit B 12 and Iron panel today. - B12 and Folate Panel - Iron, TIBC and Ferritin Panel  Other orders - sulfamethoxazole-trimethoprim (BACTRIM DS,SEPTRA DS) 800-160 MG tablet; Take 1 tablet by mouth 2 (two) times daily for 3 days. - fluconazole (DIFLUCAN) 150 MG tablet; Take 1 tablet (150 mg total) by mouth once for 1 dose. Take after ABTx - terconazole (TERAZOL 3) 0.8 % vaginal cream; Place 1 applicator vaginally at bedtime for 3 days. After ABTx  Merrily Brittle un placer verle hoy!  Voy a informarle de sus [employer].

## 2017-12-04 NOTE — Progress Notes (Addendum)
    [place] Dec 07, 1975 [id number]        42 y.o.  G3P3L3 married.  S/P TL.  RP: Suprapubic pain and dysuria x 5 days  HPI: Suprapubic pain and burning with miction worsening x 5 days.  No blood in urine.  No fever.  Mild increase in vaginal discharge, but no odor/itching.  Normal periods every month.   OB History  Gravida Para Term Preterm AB Living  3 3       3   SAB TAB Ectopic Multiple Live Births               # Outcome Date GA Lbr Len/2nd Weight Sex Delivery Anes PTL Lv  3 Para           2 Para           1 Para             Past medical history,surgical history, problem list, medications, allergies, family history and social history were all reviewed and documented in the EPIC chart.   Directed ROS with pertinent positives and negatives documented in the history of present illness/assessment and plan.  Exam:  Vitals:   12/04/17 1427  BP: 126/86   General appearance:  Normal  Abdomen: Suprapubic tenderness  CVAT negative bilaterally  Gynecologic exam: Vulva normal.  Speculum:  Cervix/vagina normal.  Mild increase in secretions.  Gono-Chlam done.  Wet prep done.  Bimanual exam:  Uterus AV normal size, mobile, NT.  No adnexal mass/NT bilaterally.  Wet prep negative  U/A: Yellow clear, proteins 1+, nitrites negative, white blood cells 0-5, red blood cells 0-2, moderate bacteria.  Urine culture pending.    Assessment/Plan:  41 y.o. G3P3   1. Vaginal discharge Wet prep negative, reassured. - WET PREP FOR [place], YEAST, CLUE  2. Dysuria Probable Cystitis.  Pending Urine Culture.  No allergy to Antibiotics.  Start Bactrim DS 1 tab twice a day x 3 days.  Prescription sent to pharmacy.  3. Screen for STD (sexually transmitted disease) R/O STI. - C. trachomatis/N. gonorrhoeae RNA  4. Anemia, unspecified type Denies heavy menses or rectal bleeding.  Hb 9.7 on 12/01/2017 (Hb 9.4 09/2015).  Recommend Iron-rich nutrition and Iron sulfate  supplements.  Will draw Vit B 12 and Iron panel today. - B12 and Folate Panel - Iron, TIBC and Ferritin Panel  Other orders - sulfamethoxazole-trimethoprim (BACTRIM DS,SEPTRA DS) 800-160 MG tablet; Take 1 tablet by mouth 2 (two) times daily for 3 days. - fluconazole (DIFLUCAN) 150 MG tablet; Take 1 tablet (150 mg total) by mouth once for 1 dose. Take after ABTx - terconazole (TERAZOL 3) 0.8 % vaginal cream; Place 1 applicator vaginally at bedtime for 3 days. After ABTx  [name] MD, 2:36 PM 12/04/2017

## 2017-12-04 NOTE — Addendum Note (Signed)
Addended by: Thurnell Garbe A on: 12/04/2017 03:21 PM   Modules accepted: Orders

## 2017-12-05 LAB — C. TRACHOMATIS/N. GONORRHOEAE RNA
C. trachomatis RNA, TMA: NOT DETECTED
N. gonorrhoeae RNA, TMA: NOT DETECTED

## 2017-12-05 LAB — B12 AND FOLATE PANEL
FOLATE: 8.5 ng/mL
Vitamin B-12: 774 pg/mL (ref 200–1100)

## 2017-12-05 LAB — IRON,TIBC AND FERRITIN PANEL
%SAT: 3 % — AB (ref 16–45)
FERRITIN: 3 ng/mL — AB (ref 16–232)
IRON: 11 ug/dL — AB (ref 40–190)
TIBC: 406 mcg/dL (calc) (ref 250–450)

## 2017-12-06 LAB — URINALYSIS, COMPLETE W/RFL CULTURE
Bilirubin Urine: NEGATIVE
Glucose, UA: NEGATIVE
Hyaline Cast: NONE SEEN /LPF
KETONES UR: NEGATIVE
Nitrites, Initial: NEGATIVE
Specific Gravity, Urine: 1.02 (ref 1.001–1.03)
pH: 7 (ref 5.0–8.0)

## 2017-12-06 LAB — URINE CULTURE
MICRO NUMBER:: 90945293
SPECIMEN QUALITY: ADEQUATE

## 2017-12-06 LAB — CULTURE INDICATED

## 2017-12-15 ENCOUNTER — Ambulatory Visit (INDEPENDENT_AMBULATORY_CARE_PROVIDER_SITE_OTHER): Payer: Self-pay | Admitting: Obstetrics & Gynecology

## 2017-12-15 ENCOUNTER — Encounter: Payer: Self-pay | Admitting: Obstetrics & Gynecology

## 2017-12-15 VITALS — BP 130/84

## 2017-12-15 DIAGNOSIS — N898 Other specified noninflammatory disorders of vagina: Secondary | ICD-10-CM

## 2017-12-15 DIAGNOSIS — R300 Dysuria: Secondary | ICD-10-CM

## 2017-12-15 LAB — WET PREP FOR TRICH, YEAST, CLUE

## 2017-12-15 MED ORDER — FLUCONAZOLE 150 MG PO TABS: 150.0000 mg | ORAL_TABLET | Freq: Every day | ORAL | 2 refills | Status: AC

## 2017-12-15 MED ORDER — CIPROFLOXACIN HCL 500 MG PO TABS: 500.0000 mg | ORAL_TABLET | Freq: Two times a day (BID) | ORAL | 0 refills | Status: AC

## 2017-12-15 NOTE — Progress Notes (Signed)
    [place] 12/05/1975 [id number]        42 y.o.  G3P3L3  RP: Worsening Dysuria and increased vaginal discharge with itching  HPI: Recent treatment of Klebsiella Pneumoniae Cystitis, finished Bactrim on 12/06/2017.  UTI Sxs got better, but reappeared shortly after with pain on urination, frequency and vaginal itching with a discharge.  No abnormal vaginal bleeding.  No fever   OB History  Gravida Para Term Preterm AB Living  3 3       3   SAB TAB Ectopic Multiple Live Births               # Outcome Date GA Lbr Len/2nd Weight Sex Delivery Anes PTL Lv  3 Para           2 Para           1 Para             Past medical history,surgical history, problem list, medications, allergies, family history and social history were all reviewed and documented in the EPIC chart.   Directed ROS with pertinent positives and negatives documented in the history of present illness/assessment and plan.  Exam:  Vitals:   12/15/17 1145  BP: 130/84   General appearance:  Normal  Abdomen: Normal  CVAT negative bilaterally  Gynecologic exam: Vulva normal.  Speculum:  Cervix/Vagina normal.  Increased thick discharge c/w yeasts.  Wet prep done.  Wet prep:  Yeasts present +++  U/A: Yellow clear, nitrites negative, proteins negative, white blood cells 10-20, red blood cells 0-2, moderate bacteria, few yeasts.   Assessment/Plan:  42 y.o. G3P3   1. Vaginal discharge Severe yeast vaginitis.  Will treat with fluconazole 150 mg 1 tablet daily for 3 days now and repeat the treatment after the antibiotic treatment for cystitis.  An additional refill was prescribed as needed.  Usage reviewed with patient and prescription sent to pharmacy. - WET PREP FOR [place], YEAST, CLUE  2. Dysuria Abnormal urine analysis and symptoms compatible with persistent cystitis.  Klebsiella pneumonia had a very good sensitivity to Cipro on urine culture December 04, 2017.  Will treat with ciprofloxacin  500 mg 1 tablet twice a day for 7 days.  Usage reviewed with patient and prescription sent to pharmacy. - Urinalysis,Complete w/RFL Culture  Other orders - ciprofloxacin (CIPRO) 500 MG tablet; Take 1 tablet (500 mg total) by mouth 2 (two) times daily for 7 days. - fluconazole (DIFLUCAN) 150 MG tablet; Take 1 tablet (150 mg total) by mouth daily for 3 days. - REFLEXIVE URINE CULTURE  Counseling on above issues and coordination of care more than 50% for 15 minutes.  [name] MD, 11:54 AM 12/15/2017

## 2017-12-16 ENCOUNTER — Encounter: Payer: Self-pay | Admitting: Obstetrics & Gynecology

## 2017-12-16 NOTE — Patient Instructions (Signed)
1. Vaginal discharge Severe yeast vaginitis.  Will treat with fluconazole 150 mg 1 tablet daily for 3 days now and repeat the treatment after the antibiotic treatment for cystitis.  An additional refill was prescribed as needed.  Usage reviewed with patient and prescription sent to pharmacy. - WET PREP FOR [place], YEAST, CLUE  2. Dysuria Abnormal urine analysis and symptoms compatible with persistent cystitis.  Klebsiella pneumonia had a very good sensitivity to Cipro on urine culture December 04, 2017.  Will treat with ciprofloxacin 500 mg 1 tablet twice a day for 7 days.  Usage reviewed with patient and prescription sent to pharmacy. - Urinalysis,Complete w/RFL Culture  Other orders - ciprofloxacin (CIPRO) 500 MG tablet; Take 1 tablet (500 mg total) by mouth 2 (two) times daily for 7 days. - fluconazole (DIFLUCAN) 150 MG tablet; Take 1 tablet (150 mg total) by mouth daily for 3 days. - REFLEXIVE URINE CULTURE  Verdis Frederickson, fue un placer verle de nuevo hoy!  Voy a informarle de sus [employer].

## 2017-12-17 LAB — URINALYSIS, COMPLETE W/RFL CULTURE
Bilirubin Urine: NEGATIVE
GLUCOSE, UA: NEGATIVE
HYALINE CAST: NONE SEEN /LPF
Ketones, ur: NEGATIVE
NITRITES URINE, INITIAL: NEGATIVE
PROTEIN: NEGATIVE
SPECIFIC GRAVITY, URINE: 1.02 (ref 1.001–1.03)
pH: 6.5 (ref 5.0–8.0)

## 2017-12-17 LAB — URINE CULTURE
MICRO NUMBER: 90990250
RESULT: NO GROWTH
SPECIMEN QUALITY:: ADEQUATE

## 2017-12-17 LAB — CULTURE INDICATED

## 2019-04-02 ENCOUNTER — Other Ambulatory Visit: Payer: Self-pay

## 2019-04-02 DIAGNOSIS — Z20822 Contact with and (suspected) exposure to covid-19: Secondary | ICD-10-CM

## 2019-04-05 LAB — NOVEL CORONAVIRUS, NAA: SARS-CoV-2, NAA: DETECTED — AB

## 2020-05-29 ENCOUNTER — Other Ambulatory Visit: Payer: Self-pay | Admitting: Obstetrics and Gynecology

## 2020-05-29 DIAGNOSIS — Z1231 Encounter for screening mammogram for malignant neoplasm of breast: Secondary | ICD-10-CM

## 2020-05-29 NOTE — Progress Notes (Signed)
[place] [address] [phone] (home) [phone] (work) [phone] (mobile)  DOB: 1975/11/02  Number in household: 4 Income Status: 2400.00 monthly Uninsured/self-pay  Last Mammogram: 04/18/2016 [facility]    This patient has no relevant Health Maintenance data.     Covid vaccine status        Last Pap Smear:  [facility] PAP         Overdue - PAP SMEAR-Modifier (Every 3 Years) Overdue since 05/22/2019   05/21/2016  Cytology - PAP   Only the first 1 history entries have been loaded, but more history exists.          Primary Care Provider seen in the past 12 months: none

## 2020-06-29 ENCOUNTER — Other Ambulatory Visit: Payer: Self-pay

## 2020-06-29 ENCOUNTER — Ambulatory Visit: Payer: Self-pay | Admitting: *Deleted

## 2020-06-29 ENCOUNTER — Ambulatory Visit: Payer: Self-pay

## 2020-06-29 VITALS — BP 122/84 | Wt 151.4 lb

## 2020-06-29 DIAGNOSIS — Z1239 Encounter for other screening for malignant neoplasm of breast: Secondary | ICD-10-CM

## 2020-06-29 DIAGNOSIS — N644 Mastodynia: Secondary | ICD-10-CM

## 2020-06-29 NOTE — Progress Notes (Signed)
Ms. Allecia Bells Marylen Ponto is a 45 y.o. female who presents to [facility] clinic today with complaint of left inner breast pain x 3 weeks that comes and goes. Patient rates the pain at a 3-4 out of 10.   Complaints of right ovary pain. Referred patient to the [facility] for [place] for follow-up. Let patient know that appointment is not covered by BCCCP. Patient given the [place] Application. Referral sent.   Pap Smear: Pap smear not completed today. Last Pap smear was 05/21/2016 at [facility] and was normal with negative HPV. Patient has history of an abnormal Pap smear 10/06/2015 that was ASC-H and had a colposcopy 11/09/2015 to follow up that was benign. Per ASCCP guidelines next Pap smear is due in January 2023. Last Pap smear result is available in Epic.   Physical exam: Breasts Breasts symmetrical. No skin abnormalities bilateral breasts. No nipple retraction bilateral breasts. No nipple discharge bilateral breasts. No lymphadenopathy. No lumps palpated bilateral breasts. Complaints of left breast pain at 9 o'clock 2 cm from the nipple on exam.       Pelvic/Bimanual Pap is not indicated today per BCCCP guidelines.    Smoking History: Patient has never smoked.   Patient Navigation: Patient education provided. Access to services provided for patient through [facility] program. Spanish interpreter Rudene Anda from [facility] provided.    Breast and Cervical Cancer Risk Assessment: Patient does not have family history of breast cancer, known genetic mutations, or radiation treatment to the chest before age 54. Patient does not have history of cervical dysplasia, immunocompromised, or DES exposure in-utero.  Risk Assessment    Risk Scores      06/29/2020   Last edited by: Royston Bake, CMA   5-year risk: 0.5 %   Lifetime risk: 5.5 %          A: BCCCP exam without pap smear Complaint of left inner breast pain.  P: Referred patient  to the [facility] for a diagnostic mammogram. Appointment scheduled Tuesday, July 11, 2020 at 1400.  Loletta Parish, RN 06/29/2020 2:47 PM

## 2020-06-29 NOTE — Patient Instructions (Signed)
Explained breast self awareness with Lawrence Santiago. Patient did not need a Pap smear today due to last Pap smear and HPV typing was 05/21/2016. Let her know BCCCP will cover Pap smears and HPV typing every 5 years unless has a history of abnormal Pap smears. Referred patient to the [facility] for a diagnostic mammogram. Appointment scheduled Tuesday, July 11, 2020 at 1400. Patient aware of appointment and will be there. [place] verbalized understanding.  Kendall Justo, Arvil Chaco, RN 2:47 PM

## 2020-07-11 ENCOUNTER — Ambulatory Visit
Admission: RE | Admit: 2020-07-11 | Discharge: 2020-07-11 | Disposition: A | Discharge status: Home / Self Care | Payer: No Typology Code available for payment source | Source: Ambulatory Visit | Attending: Obstetrics and Gynecology | Admitting: Obstetrics and Gynecology

## 2020-07-11 ENCOUNTER — Other Ambulatory Visit: Payer: Self-pay

## 2020-07-11 ENCOUNTER — Other Ambulatory Visit: Payer: Self-pay | Admitting: Obstetrics and Gynecology

## 2020-07-11 DIAGNOSIS — N644 Mastodynia: Secondary | ICD-10-CM

## 2020-07-14 ENCOUNTER — Other Ambulatory Visit: Payer: Self-pay

## 2020-07-14 ENCOUNTER — Ambulatory Visit
Admission: RE | Admit: 2020-07-14 | Discharge: 2020-07-14 | Disposition: A | Discharge status: Home / Self Care | Payer: No Typology Code available for payment source | Source: Ambulatory Visit | Attending: Obstetrics and Gynecology | Admitting: Obstetrics and Gynecology

## 2020-07-14 DIAGNOSIS — N644 Mastodynia: Secondary | ICD-10-CM

## 2020-07-26 ENCOUNTER — Encounter: Payer: Self-pay | Admitting: Family Medicine

## 2020-08-09 ENCOUNTER — Ambulatory Visit: Payer: Self-pay

## 2020-09-04 ENCOUNTER — Inpatient Hospital Stay: Payer: Self-pay | Attending: Obstetrics and Gynecology | Admitting: *Deleted

## 2020-09-04 ENCOUNTER — Other Ambulatory Visit: Payer: Self-pay

## 2020-09-04 VITALS — BP 112/72 | Ht 61.0 in | Wt 152.6 lb

## 2020-09-04 DIAGNOSIS — Z0000 Encounter for general adult medical examination without abnormal findings: Secondary | ICD-10-CM

## 2020-09-04 NOTE — Progress Notes (Signed)
[facility] initial Sarah Guerrero, Sarah Guerrero   Clinical Measurement:  Vitals:   09/04/20 0844  BP: 118/70   Fasting Labs Drawn Today, will review with patient when they result.   Medical History:  Patient states that she does not know if she has high cholesterol, does not have high blood pressure and she does not have diabetes.  Medications:  Patient states that she does not take medication to lower cholesterol, blood pressure or blood sugar.  Patient does not take an aspirin a day to help prevent a heart attack or stroke.    Blood pressure, self measurement: Patient states that she does not measure blood pressure from home. She checks her blood pressure N/A. She shares her readings with a health care provider: N/A.   Nutrition: Patient states that on average she eats 1 cups of fruit and 0 cups of vegetables per day. Patient states that she does eat fish at least 2 times per week. Patient eats less than half servings of whole grains. Patient drinks less than 36 ounces of beverages with added sugar weekly: yes. Patient is currently watching sodium or salt intake: yes. In the past 7 days patient has consumed drinks containing alcohol on 1 days. On a day that patient consumes drinks containing alcohol on average 3 drinks are consumed.      Physical activity:  Patient states that she gets 0 minutes of moderate and 0 minutes of vigorous physical activity each week.  Smoking status:  Patient states that she has has never smoked .   Quality of life:  Over the past 2 weeks patient states that she had little interest or pleasure in doing things: not at all. She has been feeling down, depressed or hopeless:not at all.    Risk reduction and counseling:    Health Coaching: Explained to patient about the daily recommendation for fruits and vegetables (2 cups fruit, 3 cups vegetables). Patient currently consumes tilapia twice a week. Gave patient recommendations for heart healthy  fish that she can try to incorporate into diet (salmon, tuna, mackerel, sardines or seabass). Explained to patient what whole grains are and how they can help lower cholesterol. Gave examples of whole wheat bread or pasta, brown rice, oatmeal or whole grain cereals. Encouraged patient to try and start exercising for at least 20-30 minutes a day.    Navigation:  I will notify patient of lab results.  Patient is aware of 2 more health coaching sessions and a follow up.  Time: 25 minutes

## 2020-09-05 LAB — GLUCOSE, RANDOM: Glucose: 113 mg/dL — ABNORMAL HIGH (ref 65–99)

## 2020-09-05 LAB — LIPID PANEL
Chol/HDL Ratio: 3.9 ratio (ref 0.0–4.4)
Cholesterol, Total: 155 mg/dL (ref 100–199)
HDL: 40 mg/dL (ref >39)
LDL Chol Calc (NIH): 103 mg/dL — ABNORMAL HIGH (ref 0–99)
Triglycerides: 58 mg/dL (ref 0–149)
VLDL Cholesterol Cal: 12 mg/dL (ref 5–40)

## 2020-09-05 LAB — HEMOGLOBIN A1C
Est. average glucose Bld gHb Est-mCnc: 114 mg/dL
Hgb A1c MFr Bld: 5.6 % (ref 4.8–5.6)

## 2020-09-11 ENCOUNTER — Telehealth: Payer: Self-pay

## 2020-09-11 NOTE — Telephone Encounter (Signed)
Health coaching 2   interpreter- Rudene Anda, [employer]   Labs- 155 cholesterol, 103 LDL cholesterol, 58 triglycerides, 40 HDL cholesterol, 5.6 hemoglobin A1C, 113 mean plasma glucose. Patient understands and is aware of her lab results.   Goals-  1. Reduce the amount of fried and fatty foods consumed. 2. Reduce the amount of red meats consumed. Try to substitute for lean proteins like chicken, fish, [place] and lean cuts of pork. 3. Add in more whole grains in daily diet. 4. Add in more heart healthy fish in weekly diet. 5. Reduce the amount of sweets and sugars consumed in both foods and drinks. (Sodas) 6. Reduce the amount of carbs consumed.  Start exercising for at least 20-30 minutes daily.   Navigation:  Patient is aware of 1 more health coaching sessions and a follow up. Patient is scheduled for follow-up with Internal Medicine on Thursday, Sep 14, 2020 @ 9:15 am for elevated labs. Referred patient to the [facility] program.   Time- 10 minutes

## 2020-09-14 ENCOUNTER — Encounter: Payer: Self-pay | Admitting: Student

## 2020-09-14 ENCOUNTER — Other Ambulatory Visit: Payer: Self-pay

## 2020-09-14 ENCOUNTER — Ambulatory Visit (INDEPENDENT_AMBULATORY_CARE_PROVIDER_SITE_OTHER): Payer: Self-pay | Admitting: Student

## 2020-09-14 ENCOUNTER — Other Ambulatory Visit (HOSPITAL_COMMUNITY): Payer: Self-pay

## 2020-09-14 VITALS — BP 121/56 | HR 62 | Temp 98.0°F | Ht 63.0 in | Wt 151.3 lb

## 2020-09-14 DIAGNOSIS — R109 Unspecified abdominal pain: Secondary | ICD-10-CM

## 2020-09-14 DIAGNOSIS — D509 Iron deficiency anemia, unspecified: Secondary | ICD-10-CM

## 2020-09-14 DIAGNOSIS — G8929 Other chronic pain: Secondary | ICD-10-CM

## 2020-09-14 DIAGNOSIS — M545 Low back pain, unspecified: Secondary | ICD-10-CM

## 2020-09-14 DIAGNOSIS — Z0000 Encounter for general adult medical examination without abnormal findings: Secondary | ICD-10-CM | POA: Insufficient documentation

## 2020-09-14 MED ORDER — POLYSACCHARIDE IRON COMPLEX 150 MG PO CAPS
150.0000 mg | ORAL_CAPSULE | ORAL | 2 refills | Status: DC
  Filled 2020-09-14: qty 15, 30d supply, fill #0
  Filled 2020-09-28: qty 15, 30d supply, fill #1
  Filled 2020-10-20: qty 15, 30d supply, fill #2

## 2020-09-14 MED ORDER — CYCLOBENZAPRINE HCL 5 MG PO TABS
5.0000 mg | ORAL_TABLET | Freq: Three times a day (TID) | ORAL | 0 refills | Status: AC | PRN
  Filled 2020-09-14: qty 45, 15d supply, fill #0

## 2020-09-14 NOTE — Assessment & Plan Note (Addendum)
Patient has history of iron deficiency anemia.  Ferritin and iron saturation was 3 checked in 2019.  She was started on ferrous sulfate 325 mg twice daily but stopped it due to constipation side effect.  She endorses generalized fatigue, low energy and hair loss.  States that her menstrual cycle is regular and denies heavy bleeding.  Denies blood in stool and melena.  Assessment and plan Symptoms of generalized fatigue and hair loss can be explained by severe iron deficiency anemia.  Her IDA is most likely related to menstrual cycle.  Will obtain CBC, ferritin and iron studies today.  Start patient on polysaccharide iron to reduce side effect and improve compliance.  -Pending CBC/ferritin/iron study -Start polysaccharide iron 150 mg every other day -Recheck ferritin and iron study in 4 weeks to assess the response -Print out list of food that is high in iron for patient  Addendum Hemoglobin 8.9.  Iron saturation of 3 and ferritin of 11.  These values are expected given patient was not taking any iron supplement.  Will continue polysaccharide iron and repeat CBC and ferritin in 4 weeks, they should give them enough time to apply for coverage.  Patient has an appointment with financial advisor on June 2.  -CBC and ferritin in 4 weeks

## 2020-09-14 NOTE — Assessment & Plan Note (Signed)
Patient reports lower back pain that started 1 month ago.  Denies any trauma, fall the area.  Described pain as pressure and muscle cramping, radiate down to bilateral glutes.  Pain comes and goes, better with rest and worse with sitting straight up.  Has tried ice and hot patch without much relief.  Did not take any medications for this pain.  Denies loss control of urination or bowel movements.  Denies changing in sensation or strength of bilateral lower extremities.    Assessment and plan Physical exam is reassuring.  There is no red flag of loss control of urination or bowel movement, or change in sensation and strength of bilateral lower extremities.  Without trauma, low suspicion of bony involvement.  Her pain is likely due to bilateral paraspinal muscle spasm.    - Will try low-dose of Flexeril 5 mg 3 times daily as needed.  Counseled patient on the side effects. - Advised patient to stretching exercise of lower back.  Pamphlet given

## 2020-09-14 NOTE — Progress Notes (Signed)
CC: Establish care, follow up on hyperglycemia   HPI:  Sarah Guerrero is a 45 y.o. with past medical history of iron deficiency is anemia who presented to the clinic to establish care and follow-up on high reading of blood sugar.  Patient reports lower back pain that started 1 month ago.  Denies any trauma, fall the area.  Described pain as pressure and muscle cramping, radiate down to bilateral glutes.  Pain comes and goes, better with rest and worse with sitting straight up.  Has tried ice and hot patch without much relief.  Did not take any medications for this pain.  Denies loss control of urination or bowel movements.  Denies changing in sensation or strength of bilateral lower extremities.    Patient also report abdominal cramps in the right upper quadrant and mid lower abdomen that started 3 months ago.  States the pain comes and go.  Denies nausea, vomiting, constipation, diarrhea, acid reflux.  Pain is not related to food.  States that pain is better with stretching or massaging the area.  Patient also have history of iron deficiency anemia.  Her ferritin and iron saturation was 3 in 2019.  She was started on iron supplement but stopped due to constipation side effect.  States that her menstrual cycle is regular, denies heavy bleeding.  Also denies melena or blood in stool.  She endorses generalized fatigue, hair loss and low energy.  Family history Mother-hypertension, passed away from metastatic cancer 6 years ago, unknown what type Brother-esophageal cancer  Social history Lives with the family Drinks alcohol socially Denies smoking or drug use  Surgery history C-section  Past Medical History:  Diagnosis Date  . Anemia   . Pre-diabetes    Review of Systems:    Review of Systems  Constitutional: Negative for weight loss.  Gastrointestinal: Positive for abdominal pain. Negative for blood in stool, constipation, diarrhea, heartburn, melena,  nausea and vomiting.  Musculoskeletal: Positive for back pain.    Physical Exam:  Vitals:   09/14/20 0944  BP: (!) 121/56  Pulse: 62  Temp: 98 F (36.7 C)  TempSrc: Oral  SpO2: 100%  Weight: 151 lb 4.8 oz (68.6 kg)  Height: 5\' 3"  (1.6 m)   Physical Exam Constitutional:      General: She is not in acute distress.    Appearance: She is not toxic-appearing.  HENT:     Head: Normocephalic.  Eyes:     General: No scleral icterus.       Right eye: No discharge.        Left eye: No discharge.     Conjunctiva/sclera: Conjunctivae normal.  Cardiovascular:     Rate and Rhythm: Normal rate and regular rhythm.     Heart sounds: Normal heart sounds.     Comments: No LE edema Pulmonary:     Effort: Pulmonary effort is normal. No respiratory distress.     Breath sounds: Normal breath sounds. No wheezing.  Abdominal:     General: Bowel sounds are normal. There is no distension.     Palpations: Abdomen is soft. There is no mass.     Tenderness: There is no abdominal tenderness. There is no guarding.     Comments: Negative for Murphy sign.  No hepatomegaly.   Musculoskeletal:        General: Normal range of motion.     Comments: Pain to palpation at the lower back region at L5-S1.  There are paraspinal muscle hypertonicity bilaterally.  No change in the overlying skin.  Normal range of motion of bilateral lower extremities.  Normal gait.  Neurological:     General: No focal deficit present.     Mental Status: She is alert.  Psychiatric:        Thought Content: Thought content normal.        Judgment: Judgment normal.     Assessment & Plan:   See Encounters Tab for problem based charting.  Patient discussed with Dr. Dareen Piano

## 2020-09-14 NOTE — Patient Instructions (Addendum)
Ms. Jerilee Hoh,  It was nice seeing you in the clinic today.  Here is a summary of what we talked about:  1.  Iron deficiency is anemia: I will check some blood work today.  I will start iron supplement, please take 1 tablet every other day.  We will repeat blood work in 4 weeks.  2.  Low back pain: I sent the muscle relaxant medication to the pharmacy.  This medication can cause drowsiness so please be careful.  Please set up an appointment with our financial advisor.  Return in 3 months  Take care,   Dr. Alfonse Spruce

## 2020-09-14 NOTE — Assessment & Plan Note (Addendum)
-   Pending insurance coverage for colonoscopy - Pap smear and mammogram with [facility] program

## 2020-09-14 NOTE — Assessment & Plan Note (Signed)
Patient also report abdominal cramps in the right upper quadrant and mid lower abdomen that started 3 months ago.  States the pain comes and go.  Denies nausea, vomiting, constipation, diarrhea, acid reflux.  Pain is not related to food.  States that pain is better with stretching or massaging the area.  Assessment and plan Abdominal exam is benign and reassuring.  Negative for Murphy sign.  CMP checked in the past was remarkable.  Suspect this is gas pain.  -Advised patient to try simethicone.  She will let us know if symptoms worsen

## 2020-09-15 LAB — CBC
Hematocrit: 31.3 % — ABNORMAL LOW (ref 34.0–46.6)
Hemoglobin: 8.9 g/dL — ABNORMAL LOW (ref 11.1–15.9)
MCH: 20 pg — ABNORMAL LOW (ref 26.6–33.0)
MCHC: 28.4 g/dL — ABNORMAL LOW (ref 31.5–35.7)
MCV: 70 fL — ABNORMAL LOW (ref 79–97)
Platelets: 427 10*3/uL (ref 150–450)
RBC: 4.45 x10E6/uL (ref 3.77–5.28)
RDW: 17.1 % — ABNORMAL HIGH (ref 11.7–15.4)
WBC: 5.4 10*3/uL (ref 3.4–10.8)

## 2020-09-15 LAB — IRON AND TIBC
Iron Saturation: 3 % — CL (ref 15–55)
Iron: 15 ug/dL — ABNORMAL LOW (ref 27–159)
Total Iron Binding Capacity: 466 ug/dL — ABNORMAL HIGH (ref 250–450)
UIBC: 451 ug/dL — ABNORMAL HIGH (ref 131–425)

## 2020-09-15 LAB — FERRITIN: Ferritin: 11 ng/mL — ABNORMAL LOW (ref 15–150)

## 2020-09-15 NOTE — Progress Notes (Signed)
Internal Medicine Clinic Attending  Case discussed with Dr. Nguyen  At the time of the visit.  We reviewed the resident's history and exam and pertinent patient test results.  I agree with the assessment, diagnosis, and plan of care documented in the resident's note. 

## 2020-09-15 NOTE — Addendum Note (Signed)
Addended byGaylan Gerold on: 09/15/2020 01:56 PM   Modules accepted: Orders

## 2020-09-28 ENCOUNTER — Other Ambulatory Visit (HOSPITAL_COMMUNITY): Payer: Self-pay

## 2020-09-28 ENCOUNTER — Ambulatory Visit: Payer: No Typology Code available for payment source

## 2020-10-20 ENCOUNTER — Other Ambulatory Visit (HOSPITAL_COMMUNITY): Payer: Self-pay

## 2020-10-31 ENCOUNTER — Encounter: Payer: Self-pay | Admitting: *Deleted

## 2020-11-02 ENCOUNTER — Other Ambulatory Visit: Payer: Self-pay | Admitting: Student

## 2020-11-02 ENCOUNTER — Other Ambulatory Visit (HOSPITAL_COMMUNITY): Payer: Self-pay

## 2020-11-02 DIAGNOSIS — D509 Iron deficiency anemia, unspecified: Secondary | ICD-10-CM

## 2020-11-03 ENCOUNTER — Other Ambulatory Visit (HOSPITAL_COMMUNITY): Payer: Self-pay

## 2020-11-03 MED ORDER — POLYSACCHARIDE IRON COMPLEX 150 MG PO CAPS
150.0000 mg | ORAL_CAPSULE | ORAL | 2 refills | Status: DC
  Filled 2020-11-03 – 2020-11-06 (×2): qty 15, 30d supply, fill #0
  Filled 2020-12-14: qty 15, 30d supply, fill #1
  Filled 2021-01-30: qty 15, 30d supply, fill #2

## 2020-11-03 NOTE — Telephone Encounter (Signed)
Will refill iron, patient in need of follow up appointment to repeat iron studies per Dr. Lamont Snowball last note. Thanks!

## 2020-11-06 ENCOUNTER — Other Ambulatory Visit (HOSPITAL_COMMUNITY): Payer: Self-pay

## 2020-12-14 ENCOUNTER — Other Ambulatory Visit (HOSPITAL_COMMUNITY): Payer: Self-pay

## 2020-12-14 ENCOUNTER — Other Ambulatory Visit: Payer: Self-pay

## 2020-12-14 ENCOUNTER — Ambulatory Visit (INDEPENDENT_AMBULATORY_CARE_PROVIDER_SITE_OTHER): Payer: Self-pay | Admitting: Internal Medicine

## 2020-12-14 ENCOUNTER — Encounter: Payer: Self-pay | Admitting: Internal Medicine

## 2020-12-14 VITALS — BP 128/74 | HR 70 | Temp 98.1°F | Resp 18 | Ht 62.0 in | Wt 148.9 lb

## 2020-12-14 DIAGNOSIS — Z87410 Personal history of cervical dysplasia: Secondary | ICD-10-CM

## 2020-12-14 DIAGNOSIS — D509 Iron deficiency anemia, unspecified: Secondary | ICD-10-CM

## 2020-12-14 NOTE — Assessment & Plan Note (Signed)
Patient reports that she is been feeling well.  States that her generalized fatigue and weakness has improved.  She has been taking iron supplements every other day.  She initially endorse some constipation however states this is improved.  Denies any blood in her stool or melena.  She does endorse a recent heavy menstruation cycle in which she also passed clots.  States that typically her cycles 4 days long and regular with the first day being heavy and then the remaining 3 being normal to light.  However this past cycle she states was heavy all 4 days.  Discussed her iron deficiency anemia is most likely due to menorrhagia.  Discussed that if her iron studies remain low may need to consider IV iron as well as hormonal therapy to help control her menorrhagia.   Plan: -Follow-up CBC and iron studies -Continue iron polysaccharide 150 mg every other day

## 2020-12-14 NOTE — Patient Instructions (Signed)
I will call you with the results of your blood work and iron studies.

## 2020-12-14 NOTE — Progress Notes (Signed)
   CC: Iron deficiency  HPI:  [name] is a 45 y.o. with a past medical history of iron deficiency anemia presenting for follow-up evaluation of her iron deficiency anemia. For details of today's visit and the status of his chronic medical issues please refer to the assessment and plan.   Past Medical History:  Diagnosis Date   Anemia    Pre-diabetes    Review of Systems: Negative except as per assessment and plan  Physical Exam:  Vitals:   12/14/20 1430  BP: 128/74  Pulse: 70  Resp: 18  Temp: 98.1 F (36.7 C)  TempSrc: Oral  SpO2: 100%  Weight: 148 lb 14.4 oz (67.5 kg)  Height: '5\' 2"'$  (1.575 m)    Physical Exam General: alert, appears stated age, in no acute distress HEENT: Normocephalic, atraumatic, EOM intact, conjunctiva normal, no conjunctival pallor, appropriate capillary refill CV: Regular rate and rhythm, no murmurs rubs or gallops Pulm: Clear to auscultation bilaterally, normal work of breathing Abdomen: Soft, nondistended, bowel sounds present, no tenderness to palpation MSK: No lower extremity edema Skin: Warm and dry Neuro: Alert and oriented x3   Assessment & Plan:   See Encounters Tab for problem based charting.  Patient discussed with Dr. Evette Guerrero

## 2020-12-14 NOTE — Assessment & Plan Note (Signed)
Patient states she had a Pap smear in January of this year at the breast center.  Will try to obtain records.

## 2020-12-15 LAB — CBC WITH DIFFERENTIAL/PLATELET
Basophils Absolute: 0.1 10*3/uL (ref 0.0–0.2)
Basos: 1 %
EOS (ABSOLUTE): 0.1 10*3/uL (ref 0.0–0.4)
Eos: 2 %
Hematocrit: 35.6 % (ref 34.0–46.6)
Hemoglobin: 11.4 g/dL (ref 11.1–15.9)
Immature Grans (Abs): 0 10*3/uL (ref 0.0–0.1)
Immature Granulocytes: 0 %
Lymphocytes Absolute: 1.7 10*3/uL (ref 0.7–3.1)
Lymphs: 25 %
MCH: 26.1 pg — ABNORMAL LOW (ref 26.6–33.0)
MCHC: 32 g/dL (ref 31.5–35.7)
MCV: 82 fL (ref 79–97)
Monocytes Absolute: 0.4 10*3/uL (ref 0.1–0.9)
Monocytes: 6 %
Neutrophils Absolute: 4.4 10*3/uL (ref 1.4–7.0)
Neutrophils: 66 %
Platelets: 330 10*3/uL (ref 150–450)
RBC: 4.36 x10E6/uL (ref 3.77–5.28)
RDW: 17.1 % — ABNORMAL HIGH (ref 11.7–15.4)
WBC: 6.7 10*3/uL (ref 3.4–10.8)

## 2020-12-15 LAB — IRON,TIBC AND FERRITIN PANEL
Ferritin: 22 ng/mL (ref 15–150)
Iron Saturation: 16 % (ref 15–55)
Iron: 61 ug/dL (ref 27–159)
Total Iron Binding Capacity: 379 ug/dL (ref 250–450)
UIBC: 318 ug/dL (ref 131–425)

## 2020-12-15 NOTE — Progress Notes (Signed)
Internal Medicine Clinic Attending ° °Case discussed with Dr. Rehman  At the time of the visit.  We reviewed the resident’s history and exam and pertinent patient test results.  I agree with the assessment, diagnosis, and plan of care documented in the resident’s note.  ° °

## 2020-12-25 ENCOUNTER — Encounter: Payer: No Typology Code available for payment source | Admitting: Internal Medicine

## 2021-01-30 ENCOUNTER — Other Ambulatory Visit (HOSPITAL_COMMUNITY): Payer: Self-pay

## 2021-06-20 ENCOUNTER — Telehealth: Payer: Self-pay

## 2021-06-20 NOTE — Telephone Encounter (Signed)
Health Coaching 3  interpreter- Rudene Anda, [facility]   Goals- Patient has been struggling with diet and exercise recently.   New goal- Spoke with patient about trying to get back on track with a well balanced diet. Encouraged patient to try and get a variety of fruits and vegetables in her daily diet. As well as lean proteins and whole grains. Encourage patient to watch sugar and carb intake due to glucose being elevated during initial screening. Encouraged patient to try and start exercising again with a goal of 20-30 minutes per day.  Barrier to reaching goal-    Strategies to overcome-    Navigation:  Patient is aware of  a follow up session. Patient is scheduled for follow-up visit on July 16, 2021 @ 1:30 PM.   Time-  10 minutes

## 2021-07-16 ENCOUNTER — Ambulatory Visit: Payer: Self-pay

## 2021-07-23 ENCOUNTER — Ambulatory Visit: Payer: Self-pay

## 2021-07-30 ENCOUNTER — Other Ambulatory Visit: Payer: Self-pay

## 2021-07-30 ENCOUNTER — Inpatient Hospital Stay: Payer: Self-pay | Attending: Obstetrics and Gynecology | Admitting: *Deleted

## 2021-07-30 VITALS — BP 114/74 | Ht 61.0 in | Wt 147.2 lb

## 2021-07-30 DIAGNOSIS — D242 Benign neoplasm of left breast: Secondary | ICD-10-CM

## 2021-07-30 DIAGNOSIS — Z0000 Encounter for general adult medical examination without abnormal findings: Secondary | ICD-10-CM

## 2021-07-30 NOTE — Progress Notes (Addendum)
Wisewoman follow up ?  ?Interpreter: Rudene Anda, [place] ? ?Clinical Measurement:   ?Vitals:  ? 07/30/21 1508  ?BP: 118/76  ?  ?  ?Medical History:  Patient states that she  does not know if she has  high cholesterol, does not have high blood pressure and she does not have diabetes. ? ?Medications:  Patient states that she does not take medication to lower cholesterol, blood pressure and blood sugar.  Patient does not take an aspirin a day to help prevent a heart attack or stroke. ?  ?Blood pressure, self measurement: Patient states that she does measure blood pressure from home. She checks her blood pressure monthly. She shares her readings with a health care provider: no. ?  ?Nutrition: Patient states that on average she eats 2 cups of fruit and 0 cups of vegetables per day. Patient states that she does eat fish at least 2 times per week. Patient eats less than half servings of whole grains. Patient drinks less than 36 ounces of beverages with added sugar weekly: no. Patient is currently watching sodium or salt intake: yes. In the past 7 days patient has had 1 drinks containing alcohol. On average patient drinks 3 drinks containing alcohol per day.     ? ?Physical activity:  Patient states that she gets 0 minutes of moderate and 0 minutes of vigorous physical activity each week. ? ?Smoking status:  Patient states that she has has never smoked . ?  ?Quality of life:  Over the past 2 weeks patient states that she had little interest or pleasure in doing things: not at all. She has been feeling down, depressed or hopeless:not at all.  ?  ?Risk reduction and counseling:  ? ?Health Coaching: ? ?Spoke with patient about increasing daily vegetable intake. Showed patient what a serving size would look like. Spoke with patient about Increasing whole grain intake. Gave suggestions for whole grains that she can try adding in diet (whole wheat bread, brown rice, whole wheat pasta, whole grain cereals and oatmeal). Patient  consumes on average 4-5 sodas per week. Spoke with patient about decreasing soda intake. Patient has not been exercising recently spoke with patient about trying to start exercising for at least 20 minutes daily.   ?  ?Navigation: This was the  follow up session for this patient, I will check up on her progress in the coming months. ? ?Time: 20 minutes  ?

## 2021-08-29 ENCOUNTER — Ambulatory Visit: Payer: Self-pay

## 2021-08-29 ENCOUNTER — Other Ambulatory Visit: Payer: Self-pay

## 2021-09-06 ENCOUNTER — Ambulatory Visit
Admission: RE | Admit: 2021-09-06 | Discharge: 2021-09-06 | Disposition: A | Discharge status: Home / Self Care | Payer: Self-pay | Source: Ambulatory Visit | Attending: Obstetrics and Gynecology | Admitting: Obstetrics and Gynecology

## 2021-09-06 ENCOUNTER — Ambulatory Visit: Payer: Self-pay | Admitting: *Deleted

## 2021-09-06 VITALS — BP 114/72 | Wt 147.8 lb

## 2021-09-06 DIAGNOSIS — Z01419 Encounter for gynecological examination (general) (routine) without abnormal findings: Secondary | ICD-10-CM

## 2021-09-06 DIAGNOSIS — N644 Mastodynia: Secondary | ICD-10-CM

## 2021-09-06 DIAGNOSIS — D242 Benign neoplasm of left breast: Secondary | ICD-10-CM

## 2021-09-06 DIAGNOSIS — Z1211 Encounter for screening for malignant neoplasm of colon: Secondary | ICD-10-CM

## 2021-09-06 NOTE — Patient Instructions (Signed)
Explained breast self awareness with Lawrence Santiago. Pap smear completed today. Let her know BCCCP will cover Pap smears and HPV typing every 5 years unless has a history of abnormal Pap smears. Referred patient to the [facility] for a diagnostic mammogram per recommendation. Appointment scheduled Thursday, Sep 06, 2021 at 1240. Patient aware of appointment and will be there. Let patient know will follow up with her within the next couple weeks with results of Pap smear by phone. [place] verbalized understanding. ? ?Roylene Heaton, Arvil Chaco, RN ?10:17 AM ? ? ? ? ?

## 2021-09-06 NOTE — Progress Notes (Signed)
Sarah Guerrero is a 46 y.o. G3P3 female who presents to [facility] clinic today with no complaints. Patients last bilateral diagnostic mammogram was completed 07/11/2020 that a left breast aspiration was recommended for follow up that was completed 07/14/2020. A left breast ultrasound was recommended for follow up in 13-month that was not completed. ?  ?Pap Smear: Pap smear completed today. [facility] normal with negative HPV. Patient has history of an abnormal Pap smear 10/06/2015 that was ASC-H and had a colposcopy 11/09/2015 to follow up that was benign. Last Pap smear result is available in Epic. ?  ?Physical exam: ?Breasts ?Breasts symmetrical. No skin abnormalities bilateral breasts. No nipple retraction bilateral breasts. No nipple discharge bilateral breasts. No lymphadenopathy. No lumps palpated bilateral breasts. Complaints of left upper outer breast pain on exam.     ? ?MS DIGITAL DIAG TOMO BILAT ? ?Result Date: 07/11/2020 ?CLINICAL DATA:  46year old female with focal pain along the medial left breast. EXAM: DIGITAL DIAGNOSTIC BILATERAL MAMMOGRAM WITH TOMOSYNTHESIS AND CAD; ULTRASOUND LEFT BREAST LIMITED TECHNIQUE: Bilateral digital diagnostic mammography and breast tomosynthesis was performed. The images were evaluated with computer-aided detection.; Targeted ultrasound examination of the left breast was performed COMPARISON:  Previous exam(s). ACR Breast Density Category c: The breast tissue is heterogeneously dense, which may obscure small masses. FINDINGS: A radiopaque BB was placed at the site of the patient's focal pain in the medial left breast. An oval, circumscribed equal density mass is seen just medial to the BB. Otherwise, no suspicious mammographic findings in the remainder of either breast. Targeted ultrasound is performed, showing 2 adjacent oval, circumscribed hypoechoic masses at the 9:30 position 7 cm from the nipple. 1 minutes  mass is wider than tall and has the appearance of a complicated cyst. It measures 3 x 3 x 2 mm. A second mass is taller than wide and has slightly irregular margins, measuring 4 x 3 x 2 mm. This demonstrates mild shadowing but no vascularity. Additionally, an incidental oval, circumscribed hypoechoic mass is demonstrated at the 11 o'clock position 1 cm from the nipple. It measures 10 x 8 x 3 mm. There is no internal vascularity. Evaluation of the left axilla demonstrates no suspicious lymphadenopathy. IMPRESSION: 1. Two indeterminate hypoechoic masses at the 9:30 position 7 cm from the nipple on the left. 2. 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 3. No suspicious left axillary lymphadenopathy. 4. No mammographic evidence of malignancy on the right. RECOMMENDATION: 1. Ultrasound-guided aspiration is recommended for the 2 adjacent hypoechoic masses at the 9:30 position 7 cm from the nipple on the left. If they do not aspirate to completion, ultrasound-guided biopsy is recommended. 2. If above aspiration/biopsy demonstrates benign results, 6 months ultrasound follow-up is recommended for the additional mass at the 11 o'clock position on the left. I have discussed the findings and recommendations with the patient. If applicable, a reminder letter will be sent to the patient regarding the next appointment. BI-RADS CATEGORY  4: Suspicious. Electronically Signed   By: SKristopher OppenheimM.D.   On: 07/11/2020 16:14   ?  ?Pelvic/Bimanual ?Ext Genitalia ?No lesions, no swelling and no discharge observed on external genitalia.      ?  ?Vagina ?Vagina pink and normal texture. No lesions and scant amount of blood observed in vagina that per patient is consistent with the end of patients menstrual period.      ?  ?  Cervix ?Cervix is present. Cervix pink and of normal texture. No discharge observed.  ?  ?Uterus ?Uterus is present and palpable. Uterus in normal position and  normal size.      ?  ?Adnexae ?Bilateral ovaries present and palpable. No tenderness on palpation.       ?  ?Rectovaginal ?No rectal exam completed today since patient had no rectal complaints. No skin abnormalities observed on exam.   ?  ?Smoking History: ?Patient has never smoked. ?  ?Patient Navigation: ?Patient education provided. Access to services provided for patient through [facility] program. Spanish interpreter Lenard Lance from [facility] provided.  ? ?Colorectal Cancer Screening: ?Per patient has never had colonoscopy completed. FIT Test given to patient to complete. No complaints today.  ?  ?Breast and Cervical Cancer Risk Assessment: ?Patient does not have family history of breast cancer, known genetic mutations, or radiation treatment to the chest before age 44. Patient does not have history of cervical dysplasia, immunocompromised, or DES exposure in-utero. ? ?Risk Assessment   ? ? Risk Scores   ? ?   09/06/2021 06/29/2020  ? Last edited by: Demetrius Revel, LPN Royston Bake, [place]  ? 5-year risk: 0.5 % 0.5 %  ? Lifetime risk: 5.4 % 5.5 %  ? ?  ?  ? ?  ? ? ?A: ?BCCCP exam with pap smear ?No complaints. ? ?P: ?Referred patient to the [facility] for a diagnostic mammogram per recommendation. Appointment scheduled Thursday, Sep 06, 2021 at 1240. ? ?Loletta Parish, RN ?09/06/2021 10:17 AM   ?

## 2021-09-10 LAB — CYTOLOGY - PAP
Comment: NEGATIVE
Diagnosis: UNDETERMINED — AB
High risk HPV: NEGATIVE

## 2021-09-11 ENCOUNTER — Telehealth: Payer: Self-pay

## 2021-09-11 NOTE — Telephone Encounter (Signed)
Via, Rudene Anda, [place] Interpreter [facility]), Patient informed pap results, ASC-US with negative HPV, repeat pap in 1 year. Patient verbalized understanding.  ?

## 2021-09-14 LAB — FECAL OCCULT BLOOD, IMMUNOCHEMICAL: Fecal Occult Bld: NEGATIVE

## 2021-09-17 ENCOUNTER — Telehealth: Payer: Self-pay

## 2021-09-17 NOTE — Telephone Encounter (Signed)
Via, Lavon Paganini, Spanish Interpreter [facility]), attempted to contact patient regarding lab results (FIT Test). Left message requesting a return call.

## 2021-09-21 ENCOUNTER — Telehealth: Payer: Self-pay

## 2021-09-21 NOTE — Telephone Encounter (Signed)
09/18/2021-Via Sarah Guerrero, Patient informed negative FIT test results. Patient verbalized understanding.

## 2021-11-05 ENCOUNTER — Other Ambulatory Visit: Payer: Self-pay

## 2021-11-05 ENCOUNTER — Inpatient Hospital Stay: Payer: Self-pay | Attending: Obstetrics and Gynecology | Admitting: *Deleted

## 2021-11-05 VITALS — BP 113/66 | Ht 61.0 in | Wt 150.5 lb

## 2021-11-05 DIAGNOSIS — Z0000 Encounter for general adult medical examination without abnormal findings: Secondary | ICD-10-CM

## 2021-11-05 NOTE — Progress Notes (Signed)
Wisewoman initial screening   Interpreter- Rudene Anda, [place]   Clinical Measurement:  Vitals:   11/05/21 0948  BP: (!) 140/100   Fasting Labs Drawn Today, will review with patient when they result.   Medical History:  Patient states that she  does not know if she has  high cholesterol, does not have high blood pressure and she does not have diabetes.  Medications:  Patient states that she does not take medication to lower cholesterol, blood pressure or blood sugar.  Patient does not take an aspirin a day to help prevent a heart attack or stroke.    Blood pressure, self measurement: Patient states that she does not measure blood pressure from home. She checks her blood pressure N/A. She shares her readings with a health care provider: N/A.   Nutrition: Patient states that on average she eats 5 cups of fruit and 0 cups of vegetables per day. Patient states that she does not eat fish at least 2 times per week. Patient eats less than half servings of whole grains. Patient drinks less than 36 ounces of beverages with added sugar weekly: no. Patient is currently watching sodium or salt intake: yes. In the past 7 days patient has consumed drinks containing alcohol on 1 days. On a day that patient consumes drinks containing alcohol on average 4 drinks are consumed.      Physical activity:  Patient states that she gets 90 minutes of moderate and 90 minutes of vigorous physical activity each week.  Smoking status:  Patient states that she has has never smoked .   Quality of life:  Over the past 2 weeks patient states that she had little interest or pleasure in doing things: several days. She has been feeling down, depressed or hopeless:several days.    Risk reduction and counseling:   Health Coaching: Spoke with patient about the daily recommendation for vegetables (3 cups). Showed patient what a serving size looks like. Patient consumes vegetables but not every day. Patient consumes fish once a  week. Gave recommendations for heart healthy fish that patient can try adding into diet (salmon, tuna, mackerel, sardines, sea bass or trout). Patient does not consume whole grains regularly. Gave suggestions for whole wheat bread, oatmeal, brown rice, whole wheat pasta and whole grain cereals. Patient has been drinking on average 4 cups of soda per week as well as lemonade and sweetened agua de fruita.Spoke with patient about cutting back on the amount of beverages with added sugars that she consumes given elevate glucose during previous screening. Patient has a good exercise routine currently walking and running on treadmill 3 days x 60 minutes).  Goal: Patient would like to increase exercise to 45 minutes daily. Patient will start with 20-30 minutes of exercise daily on the treadmill and then increase to 45 minutes. Patient will work on this over the next 3 months.    Navigation:  I will notify patient of lab results.  Patient is aware of 2 more health coaching sessions and a follow up. Referred patient to [facility] of the [place] for counseling services.   Time: 25 minutes

## 2021-11-06 LAB — LIPID PANEL
Chol/HDL Ratio: 4.4 ratio (ref 0.0–4.4)
Cholesterol, Total: 214 mg/dL — ABNORMAL HIGH (ref 100–199)
HDL: 49 mg/dL (ref >39)
LDL Chol Calc (NIH): 145 mg/dL — ABNORMAL HIGH (ref 0–99)
Triglycerides: 111 mg/dL (ref 0–149)
VLDL Cholesterol Cal: 20 mg/dL (ref 5–40)

## 2021-11-06 LAB — HEMOGLOBIN A1C
Est. average glucose Bld gHb Est-mCnc: 117 mg/dL
Hgb A1c MFr Bld: 5.7 % — ABNORMAL HIGH (ref 4.8–5.6)

## 2021-11-06 LAB — GLUCOSE, RANDOM: Glucose: 94 mg/dL (ref 70–99)

## 2021-11-15 ENCOUNTER — Telehealth: Payer: Self-pay

## 2021-11-15 NOTE — Telephone Encounter (Signed)
Health coaching 2   interpreter- Rudene Anda, [place]- 214 cholesterol, 145 LDL cholesterol, 111 triglycerides, 49 HDL cholesterol, 5.7 hemoglobin A1C , 94 mean plasma glucose.  Patient understands and is aware of her lab results.   Goals-  1. Watch the amounts of sweets and sugars consumed. Watch the amounts of carbs consumed.  2. Practice a more heart healthy diet. Reducing the amount of fried and fatty foods consumed. Increase lean proteins like chicken and fish, vegetables and whole grains.  3. Continue with walking and running on treadmill. If possible daily for 20-30 minutes.   Navigation:  Patient is aware of 1 more health coaching sessions and a follow up.Patient is scheduled for FU with Internal Medicine on 11/20/21 @ 10:15 am.  Time-  11 minutes

## 2021-11-20 ENCOUNTER — Ambulatory Visit (INDEPENDENT_AMBULATORY_CARE_PROVIDER_SITE_OTHER): Payer: Self-pay | Admitting: Student

## 2021-11-20 ENCOUNTER — Encounter: Payer: Self-pay | Admitting: Student

## 2021-11-20 ENCOUNTER — Other Ambulatory Visit: Payer: Self-pay

## 2021-11-20 VITALS — BP 126/73 | HR 58 | Temp 98.4°F | Ht 61.0 in | Wt 151.6 lb

## 2021-11-20 DIAGNOSIS — R1033 Periumbilical pain: Secondary | ICD-10-CM

## 2021-11-20 DIAGNOSIS — D509 Iron deficiency anemia, unspecified: Secondary | ICD-10-CM

## 2021-11-20 DIAGNOSIS — Z01419 Encounter for gynecological examination (general) (routine) without abnormal findings: Secondary | ICD-10-CM | POA: Insufficient documentation

## 2021-11-20 DIAGNOSIS — R109 Unspecified abdominal pain: Secondary | ICD-10-CM

## 2021-11-20 NOTE — Progress Notes (Addendum)
CC: Abdominal pain  HPI:  [name] is a 46 y.o. female living with a history stated below and presents today for abdominal pain and a well woman followup. Please see problem based assessment and plan for additional details.  Patient was seen with interpreter Cecelia Byars [phone] Past Medical History:  Diagnosis Date   Anemia    Pre-diabetes     No current outpatient medications on file prior to visit.   No current facility-administered medications on file prior to visit.    Family History  Problem Relation Age of Onset   Cancer Mother    Cancer Brother    Esophageal cancer Brother    Diabetes Paternal Grandmother     Social History   Socioeconomic History   Marital status: Single    Spouse name: Not on file   Number of children: 3   Years of education: High School   Highest education level: Associate degree: academic program  Occupational History   Occupation: house cleaner  Tobacco Use   Smoking status: Never   Smokeless tobacco: Never  Vaping Use   Vaping Use: Never used  Substance and Sexual Activity   Alcohol use: Yes    Alcohol/week: 0.0 standard drinks of alcohol    Comment: SOCIAL   Drug use: No   Sexual activity: Yes    Partners: Male    Birth control/protection: Other-see comments, Surgical    Comment: 1st intercourse- 4, married - 41 yrs   Other Topics Concern   Not on file  Social History Narrative   Lives with Husband, 1 son and 3 daughters      From [place]   Social Determinants of Health   Financial Resource Strain: Not on file  Food Insecurity: No Food Insecurity (11/05/2021)   Hunger Vital Sign    Worried About Running Out of Food in the Last Year: Never true    Ran Out of Food in the Last Year: Never true  Transportation Needs: No Transportation Needs (11/05/2021)   PRAPARE - Hydrologist (Medical): No    Lack of Transportation (Non-Medical): No  Physical Activity: Not on file   Stress: Not on file  Social Connections: Not on file  Intimate Partner Violence: Not on file    Review of Systems: ROS negative except for what is noted on the assessment and plan.  Vitals:   11/20/21 1019  BP: 126/73  Pulse: (!) 58  Temp: 98.4 F (36.9 C)  TempSrc: Oral  SpO2: 100%  Weight: 151 lb 9.6 oz (68.8 kg)  Height: '5\' 1"'$  (1.549 m)    Physical Exam: Constitutional: anxious looking woman, in no acute distress Cardiovascular: regular rate and rhythm, no m/r/g Pulmonary/Chest: normal work of breathing on room air, lungs clear to auscultation bilaterally Abdominal: soft, L  umbilical region is tender to deep palpation, non-distended MSK: normal bulk and tone Skin: warm and dry   Assessment & Plan:   Abdominal pain Patient presents today with abdominal pain.  The pain is located just to the left of the umbilicus this.  She states that its been coming and going for about 2 months, and is worse when she sits and gets better when she stands up.  She describes the pain as sharp.  She rates the pain a 3 out of 10, and it happens about 2-3 times a day.  The pain is nonradiating, and she has never had symptoms like this before.  She also endorses  that her urine is not a "normal" color, and occasionally has pain while urinating.  She is having normal bowel movements, and her appetite has not changed.  Her last menstrual period was on June 27, and last about 3 to 4 days.  She says it was uneventful.    She denies any nausea, vomiting, diarrhea, constipation.  Plan: - Due to the patient's lack of insurance, we ordered a urine analysis, CBC, and ferritin level.  Will update patient with results - Told patient to take Tylenol every 8 hours for the pain. - We will follow-up with the patient in 1 month.  Well woman exam Patient was referred by the well woman program because of elevated cholesterol.  Her cholesterol was 214, and LDL was 145.  Patient's current ASCVD risk is  1.3%  Plan:  - At this point no statin is recommended, educated patient on the importance of diet and exercise  Iron deficiency anemia Patient has run out of her iron pills, and has requested more.  Her last CBC and iron studies was done in August 2022, and it was normal.  Plan: - Ordered a ferritin study, and CBC.  We will make decision on necessity of iron supplementation once results are back.  Patient seen with Dr. Hayden Rasmussen Raeleigh Guinn, M.D. [facility], PGY-1 Phone: [phone] Date 11/20/2021 Time 6:23 PM

## 2021-11-20 NOTE — Assessment & Plan Note (Signed)
Patient was referred by the well woman program because of elevated cholesterol.  Her cholesterol was 214, and LDL was 145.  Patient's current ASCVD risk is 1.3%  Plan:  - At this point no statin is recommended, educated patient on the importance of diet and exercise

## 2021-11-20 NOTE — Assessment & Plan Note (Addendum)
Patient presents today with abdominal pain.  The pain is located just to the left of the umbilicus.  She states that the pain has been coming and going for about 2 months, and is worse when she sits and gets better when she stands up.  She describes the pain as sharp.  She rates the pain a 3 out of 10, and it happens about 2-3 times a day.  The pain is nonradiating, and she has never had symptoms like this before.  She also endorses that her urine is not a "normal" color, and occasionally has pain while urinating.  She is having normal bowel movements, and her appetite has not changed.  Her last menstrual period was on June 27, and last about 3 to 4 days.  She says it was uneventful.    She denies any nausea, vomiting, diarrhea, constipation.  Plan: - Due to the patient's lack of insurance, I am limited to ordering a urine analysis, CBC, and ferritin level.  Will update patient with results - Told patient to take Tylenol every 8 hours for the pain. - Will follow-up with the patient in 1 month.

## 2021-11-20 NOTE — Assessment & Plan Note (Addendum)
Patient has run out of her iron pills, and has requested more.  Her last CBC and iron studies was done in August 2022, and it was normal.  Plan: - Ordered a ferritin study, and CBC.  We will make decision on necessity of iron supplementation once results are back.  Addendum: Patient's ferritin level came back with a value of 8.  Sent in ferrous sulfate 325 to pharmacy for her to take every Monday, Wednesday, and Friday.

## 2021-11-20 NOTE — Patient Instructions (Addendum)
Thank you so much for coming to the clinic today!  We talked about a few things here is a quick summary.  1.  We talked about the pain you have been having in your stomach area, I want you to take some Tylenol every 8 hours you can take 2 pills.  At [facility] [place], [place], you can get 325 mg of Tylenol over-the-counter.  2.  Today were doing some tests just to make sure everything else is okay  3.  If the pain gets worse, increases in intensity, or changes in any way, please give me a phone call or come back to the clinic.  4. We'd like to see you back in one month.  5. Your cholesterol is looking good, please remember to improve your diet and exercise.  If you have any questions at all please call the clinic at [phone]  Best, Dr. Jayme Cloud gracias por venir a la clnica hoy!  Hablamos de algunas cosas aqu es un resumen rpido.  1. Hablamos sobre el dolor que ha estado teniendo en el rea de su [place], quiero que tome un poco de Tylenol cada 8 horas, puede tomar 2 pldoras.  En [facility], [facility], [facility], puede obtener 325 mg de Tylenol sin receta.  2. Hoy estbamos haciendo algunas pruebas solo para asegurarnos de que todo lo dems est bien  3. Si el dolor empeora, aumenta la intensidad o cambia de [place], llmeme por telfono o regrese a Copy.  4. Nos gustara verte de vuelta en un mes.  5. Su colesterol se ve bien, recuerde mejorar su dieta y ejercicio.  Si tiene [employer], llame a la clnica en [phone]  Mejor Dr. Sanjuana Mae

## 2021-11-21 LAB — URINALYSIS, ROUTINE W REFLEX MICROSCOPIC
Bilirubin, UA: NEGATIVE
Glucose, UA: NEGATIVE
Ketones, UA: NEGATIVE
Leukocytes,UA: NEGATIVE
Nitrite, UA: NEGATIVE
Protein,UA: NEGATIVE
RBC, UA: NEGATIVE
Specific Gravity, UA: 1.01 (ref 1.005–1.030)
Urobilinogen, Ur: 0.2 mg/dL (ref 0.2–1.0)
pH, UA: 7 (ref 5.0–7.5)

## 2021-11-21 LAB — CBC
Hematocrit: 37.6 % (ref 34.0–46.6)
Hemoglobin: 11.1 g/dL (ref 11.1–15.9)
MCH: 22.9 pg — ABNORMAL LOW (ref 26.6–33.0)
MCHC: 29.5 g/dL — ABNORMAL LOW (ref 31.5–35.7)
MCV: 78 fL — ABNORMAL LOW (ref 79–97)
Platelets: 313 10*3/uL (ref 150–450)
RBC: 4.85 x10E6/uL (ref 3.77–5.28)
RDW: 14.5 % (ref 11.7–15.4)
WBC: 4.4 10*3/uL (ref 3.4–10.8)

## 2021-11-21 LAB — FERRITIN: Ferritin: 8 ng/mL — ABNORMAL LOW (ref 15–150)

## 2021-11-21 NOTE — Progress Notes (Signed)
Internal Medicine Clinic Attending  I saw and evaluated the patient.  I personally confirmed the key portions of the history and exam documented by Dr. Sanjuana Mae and I reviewed pertinent patient test results.  The assessment, diagnosis, and plan were formulated together and I agree with the documentation in the resident's note. I am not 100% sure what is causing this patient's intermittent abdominal pain. Given positional worsening and absence of any additional specific signs/symptoms, as well as positive Carnett sign, suspect abdominal wall etiology. UA obtained given reports of abnormal color, rule out hematuria. Will trial therapy for MSK pain and f/u within one month. Ferritin and CBC obtained to f/u history of iron deficiency and need for further supplementation.

## 2021-11-23 ENCOUNTER — Encounter: Payer: Self-pay | Admitting: Student

## 2021-11-23 MED ORDER — FERROUS SULFATE 325 (65 FE) MG PO TABS: 325.0000 mg | ORAL_TABLET | Freq: Every day | ORAL | 3 refills | Status: DC

## 2021-11-23 MED ORDER — FERROUS SULFATE 325 (65 FE) MG PO TABS: 325.0000 mg | ORAL_TABLET | Freq: Every day | ORAL | 3 refills | Status: AC

## 2021-11-23 NOTE — Addendum Note (Signed)
Addended byDrucie Opitz on: 11/23/2021 08:56 AM   Modules accepted: Orders

## 2021-11-23 NOTE — Progress Notes (Signed)
Will do! Thank you Dr. Cain Sieve!

## 2022-01-23 ENCOUNTER — Telehealth: Payer: Self-pay

## 2022-01-23 NOTE — Telephone Encounter (Signed)
Health Coaching 3  interpreter- Rudene Anda, [facility]   Goals- Patient states that she has been working on cutting back on the amount of red meat that she consumes. Patient states that she has been consuming more fish (filet and salmon). Patient has not been able to exercise as much due to pain that she has been experiencing.    New goal- Try chair exercises. Search exercise videos on Youtube. Patient seems motivated to try this out.  Barrier to reaching goal-    Strategies to overcome-    Navigation:  Patient is aware of  a follow up session on 03/18/22 @ 1:00 pm.    Time-  10 minutes

## 2022-03-18 ENCOUNTER — Ambulatory Visit: Payer: No Typology Code available for payment source

## 2022-04-01 ENCOUNTER — Inpatient Hospital Stay: Payer: Self-pay

## 2022-04-15 ENCOUNTER — Other Ambulatory Visit: Payer: Self-pay

## 2022-04-15 ENCOUNTER — Inpatient Hospital Stay: Payer: Self-pay | Attending: Obstetrics and Gynecology | Admitting: *Deleted

## 2022-04-15 VITALS — BP 128/76 | Ht 61.0 in | Wt 153.0 lb

## 2022-04-15 DIAGNOSIS — D242 Benign neoplasm of left breast: Secondary | ICD-10-CM

## 2022-04-15 DIAGNOSIS — Z0000 Encounter for general adult medical examination without abnormal findings: Secondary | ICD-10-CM

## 2022-04-15 DIAGNOSIS — N644 Mastodynia: Secondary | ICD-10-CM

## 2022-04-15 NOTE — Progress Notes (Signed)
Wisewoman follow up   Interpreter: Rudene Anda, [employer]    Clinical Measurement:   Vitals:   04/15/22 1008 04/15/22 1027  BP: 130/82 128/76      Medical History:  Patient states that she has high cholesterol, does not have high blood pressure and she does not have diabetes.  Medications:  Patient states that she does not take medication to lower cholesterol, blood pressure and blood sugar.  Patient does not take an aspirin a day to help prevent a heart attack or stroke.    Blood pressure, self measurement: Patient states that she does measure blood pressure from home. She checks her blood pressure monthly. She shares her readings with a health care provider: no.   Nutrition: Patient states that on average she eats 0 cups of fruit and 1 cups of vegetables per day. Patient states that she does not eat fish at least 2 times per week. Patient eats less than half servings of whole grains. Patient drinks less than 36 ounces of beverages with added sugar weekly: no. Patient is currently watching sodium or salt intake: yes. In the past 7 days patient has had 0 drinks containing alcohol. On average patient drinks 4 drinks containing alcohol per day.      Physical activity:  Patient states that she gets 135 minutes of moderate and 0 minutes of vigorous physical activity each week.  Smoking status:  Patient states that she has has never smoked .   Quality of life:  Over the past 2 weeks patient states that she had little interest or pleasure in doing things: not at all. She has been feeling down, depressed or hopeless:not at all.    Risk reduction and counseling:   Health Coaching: Spoke with patient about trying to maintain a heart healthy diet. Encouraged patient to try and add more fruits and vegetables into daily diet. The goal would be for 2 cups of fruit and 3 cups of vegetables daily. Patient has been eating salmon once a week. Encouraged her to continue with heart healthy fish as part of her  weekly diet. Patient consumes whole grain bread but no other whole grains. Explained how whole grains can also be helpful with lowering cholesterol due to higher fiber content. Gave suggestions for other whole grains such as (brown rice, whole wheat pasta, whole grain cereals or oatmeal). Patient has cut back on the amount of sodas that she consumes but is still consuming 6-8 cans per week. Encouraged patient to try and cut back to 36 ounces or less per week. Patient has been walking 3 days a week for 40-50 minutes.   Navigation: This was the  follow up session for this patient, I will check up on her progress in the coming months.  Time: 20 minutes

## 2022-04-16 ENCOUNTER — Ambulatory Visit: Payer: Self-pay | Admitting: *Deleted

## 2022-04-16 VITALS — BP 133/79 | Wt 154.0 lb

## 2022-04-16 DIAGNOSIS — N644 Mastodynia: Secondary | ICD-10-CM

## 2022-04-16 DIAGNOSIS — Z1239 Encounter for other screening for malignant neoplasm of breast: Secondary | ICD-10-CM

## 2022-04-16 NOTE — Patient Instructions (Addendum)
Explained breast self awareness with Elmyra Ricks Doran Clay. Patient did not need a Pap smear today due to last Pap smear was 09/06/2021. Let patient know that based on her last Pap smear result and history that her next Pap smear is due in one year. Referred patient to the [facility] for a diagnostic mammogram. Appointment scheduled Thursday, May 02, 2022 at 1240. Patient aware of appointment and will be there. [place] verbalized understanding.  Shakai Dolley, Arvil Chaco, RN 12:03 PM

## 2022-04-16 NOTE — Progress Notes (Signed)
Ms. Sarah Guerrero is a 46 y.o. female who presents to [facility] clinic today with complaint of left diffuse breast pain that is greater within the left inner breast x 2 months that is constant. Patient rates the pain at a 3-8 out of 10.    Pap Smear: Pap smear not completed today. Last Pap smear was 09/06/2021 at [facility] clinic and was abnormal - ASCUS with negative HPV . Patients previous Pap smear was 05/21/2016 that was normal with negative HPV. Patient has history of an abnormal Pap smear 10/06/2015 that was ASC-H and had a colposcopy 11/09/2015 to follow up that was benign. Last Pap smear result is available in Epic.    Physical exam: Breasts Breasts symmetrical. No skin abnormalities bilateral breasts. No nipple retraction bilateral breasts. No nipple discharge bilateral breasts. No lymphadenopathy. No lumps palpated bilateral breasts. Complaints of left diffuse breast pain that is greater within the left inner breast on exam.  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 Two left breast cysts aspirated 07/14/2020. Intermittent shooting pains in both breasts for the past 3 months. EXAM: DIGITAL DIAGNOSTIC BILATERAL MAMMOGRAM WITH TOMOSYNTHESIS AND CAD; ULTRASOUND LEFT BREAST LIMITED TECHNIQUE: Bilateral digital diagnostic mammography and breast tomosynthesis was performed. The images were evaluated with computer-aided detection.; Targeted ultrasound examination of the left breast was performed. COMPARISON:  Previous exam(s). ACR Breast Density Category d: The breast tissue is extremely dense, which lowers the sensitivity of mammography. FINDINGS: Mammographically normal appearing breasts with no findings suspicious for malignancy in either breast. Targeted ultrasound is performed, showing a 1.0 x 1.0 x 0 4 cm oval, horizontally oriented, circumscribed, medium echotexture mass in  the 11 o'clock position of the left breast, 1 cm from the nipple. This measured 1.0 x 0.8 x 0.3 cm on 07/11/2020. IMPRESSION: 1. 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 2. No evidence of malignancy elsewhere in either breast. RECOMMENDATION: Bilateral diagnostic mammogram and left breast ultrasound in 1 year to complete 2 years of follow-up of the probably benign left breast mass. I have discussed the findings and recommendations with the patient. If applicable, a reminder letter will be sent to the patient regarding the next appointment. BI-RADS CATEGORY  3: Probably benign. Electronically Signed   By: Claudie Revering M.D.   On: 09/06/2021 13:41  MS DIGITAL DIAG TOMO BILAT  Result Date: 07/11/2020 CLINICAL DATA:  46 year old female with focal pain along the medial left breast. EXAM: DIGITAL DIAGNOSTIC BILATERAL MAMMOGRAM WITH TOMOSYNTHESIS AND CAD; ULTRASOUND LEFT BREAST LIMITED TECHNIQUE: Bilateral digital diagnostic mammography and breast tomosynthesis was performed. The images were evaluated with computer-aided detection.; Targeted ultrasound examination of the left breast was performed COMPARISON:  Previous exam(s). ACR Breast Density Category c: The breast tissue is heterogeneously dense, which may obscure small masses. FINDINGS: A radiopaque BB was placed at the site of the patient's focal pain in the medial left breast. An oval, circumscribed equal density mass is seen just medial to the BB. Otherwise, no suspicious mammographic findings in the remainder of either breast. Targeted ultrasound is performed, showing 2 adjacent oval, circumscribed hypoechoic masses at the 9:30 position 7 cm from the nipple. 1 minutes mass is wider than tall and has the appearance of a complicated cyst. It measures 3 x 3 x 2 mm. A  second mass is taller than wide and has slightly irregular margins, measuring 4 x 3 x 2 mm. This  demonstrates mild shadowing but no vascularity. Additionally, an incidental oval, circumscribed hypoechoic mass is demonstrated at the 11 o'clock position 1 cm from the nipple. It measures 10 x 8 x 3 mm. There is no internal vascularity. Evaluation of the left axilla demonstrates no suspicious lymphadenopathy. IMPRESSION: 1. Two indeterminate hypoechoic masses at the 9:30 position 7 cm from the nipple on the left. 2. 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 3. No suspicious left axillary lymphadenopathy. 4. No mammographic evidence of malignancy on the right. RECOMMENDATION: 1. Ultrasound-guided aspiration is recommended for the 2 adjacent hypoechoic masses at the 9:30 position 7 cm from the nipple on the left. If they do not aspirate to completion, ultrasound-guided biopsy is recommended. 2. If above aspiration/biopsy demonstrates benign results, 6 months ultrasound follow-up is recommended for the additional mass at the 11 o'clock position on the left. I have discussed the findings and recommendations with the patient. If applicable, a reminder letter will be sent to the patient regarding the next appointment. BI-RADS CATEGORY  4: Suspicious. Electronically Signed   By: Kristopher Oppenheim M.D.   On: 07/11/2020 16:14        Pelvic/Bimanual Pap is not indicated today per BCCCP guidelines.   Smoking History: Patient has never smoked.    Patient Navigation: Patient education provided. Access to services provided for patient through [place] program. Spanish interpreter Rudene Anda from [facility] provided.    Colorectal Cancer Screening: Per patient has never had colonoscopy completed. FIT Test completed 09/06/2021 that was negative. No complaints today.    Breast and Cervical Cancer Risk Assessment: Patient does not have family history of breast cancer, known genetic mutations, or radiation treatment to the chest before age 67. Patient has history of  cervical dysplasia. Patient has no history of being immunocompromised or DES exposure in-utero.  Risk Scores as of 04/16/2022     [place] Jan[place]           5-year 0.74 %   Lifetime 7.57 %   This patient is [place]/Latina but has no documented birth country, so the [place] used data from [place] patients to calculate their risk score. Document a birth country in the Demographics activity for a more accurate score.         Last calculated by Claretha Cooper, CMA on 04/16/2022 at 11:57 AM        A: BCCCP exam without pap smear Complaint of left diffuse breast pain greater within the inner breast.  P: Referred patient to the [facility] for a diagnostic mammogram. Appointment scheduled Thursday, May 02, 2022 at 1240.  Loletta Parish, RN 04/16/2022 12:03 PM

## 2022-05-02 ENCOUNTER — Other Ambulatory Visit: Payer: No Typology Code available for payment source

## 2022-07-31 NOTE — Addendum Note (Signed)
Addended by: Jonna Clark E on: 07/31/2022 04:05 PM   Modules accepted: Orders

## 2022-09-12 ENCOUNTER — Inpatient Hospital Stay: Admission: RE | Admit: 2022-09-12 | Payer: Self-pay | Source: Ambulatory Visit

## 2022-09-12 ENCOUNTER — Other Ambulatory Visit: Payer: Self-pay

## 2022-09-12 ENCOUNTER — Ambulatory Visit: Payer: Self-pay

## 2022-10-17 ENCOUNTER — Other Ambulatory Visit: Payer: Self-pay

## 2022-10-17 ENCOUNTER — Ambulatory Visit: Payer: Self-pay

## 2022-11-25 ENCOUNTER — Ambulatory Visit: Payer: Self-pay

## 2022-11-25 ENCOUNTER — Other Ambulatory Visit: Payer: Self-pay

## 2022-12-16 ENCOUNTER — Other Ambulatory Visit: Payer: Self-pay

## 2022-12-16 ENCOUNTER — Inpatient Hospital Stay: Payer: Self-pay | Attending: Obstetrics and Gynecology | Admitting: *Deleted

## 2022-12-16 VITALS — BP 120/78 | Ht 61.0 in | Wt 145.2 lb

## 2022-12-16 DIAGNOSIS — Z0000 Encounter for general adult medical examination without abnormal findings: Secondary | ICD-10-CM

## 2022-12-16 NOTE — Progress Notes (Signed)
Wisewoman initial screening   Interpreter- Natale Lay, [place]   Clinical Measurement:  Vitals:   12/16/22 0938 12/16/22 1301  BP: 122/76 120/78   Fasting Labs Drawn Today, will review with patient when they result.   Medical History: Patient states that she does not have high cholesterol, does not have high blood pressure and she does not have diabetes.  Medications: Patient states that she does not take medication to lower cholesterol, blood pressure or blood sugar.  Patient does not take an aspirin a day to help prevent a heart attack or stroke.    Blood pressure, self measurement: Patient states that she does not measure blood pressure from home. She checks her blood pressure N/A. She shares her readings with a health care provider: N/A.   Nutrition: Patient states that on average she eats 2 cups of fruit and 0 cups of vegetables per day. Patient states that she does eat fish at least 2 times per week. Patient eats less than half servings of whole grains. Patient drinks less than 36 ounces of beverages with added sugar weekly: no. Patient is currently watching sodium or salt intake: yes. In the past 7 days patient has consumed drinks containing alcohol on 1 days. On a day that patient consumes drinks containing alcohol on average 2 drinks are consumed.      Physical activity: Patient states that she gets 0 minutes of moderate and 0 minutes of vigorous physical activity each week.  Smoking status: Patient states that she has has never smoked .   Quality of life: Over the past 2 weeks patient states that she had little interest or pleasure in doing things: not at all. She has been feeling down, depressed or hopeless:not at all.   Social Determinants of Health Assessment:   Computer Use: During the last 12 months patient states that she has used any of the following: desktop/laptop, smart phone or tablet/other portable wireless computer: yes.   Internet Use: During the last 12 months,  did you or any member of your household have access to the internet: Yes, by paying a cell phone company or internet service provider.   Food Insecurities: During the last 12 months, where there any times when you were worried that you would run out of food because of a lack of money or other resources: No.   Transportation Barriers: During the last 12 months, have you missed a doctor's appointment because of transportation problems: No.   Childcare Barriers: If you are currently using childcare services, please identify  the type of services you use. (If not using childcare services, please select "Not applicable"): not applicable. During the last 12 months, have you had any barriers to childcare services such as: not applicable.   Housing: What is your housing situation today: I have housing.   Intimate Partner Violence: During the last 12 months, how often did your partner physically hurt you: never. During the last 12 months, how often did your partner insult you or talk down to you: never.  Medication Adherence: During the last 12 months, did you ever forget to take your medicine: not applicable. During the last 12 months, were you careless ar times about taking your medicine: not applicable. During the last 12 months, when you felt better did you sometimes stop taking your medication: not applicable. During the last 12 months, sometimes if you felt worse when you took your medicine did you stop taking it: not applicable.   Risk reduction and counseling:  Health Coaching: Spoke with patient about the daily recommendation for fruits and vegetables. Showed patient what a serving size would look like. Patient has been consuming a good amount of heart healthy fish each week (2-3 servings). Patient does not consume many whole grains regularly. Gave suggestions for whole wheat bread, brown rice, whole wheat pasta, oatmeal and whole grain cereals. Spoke with patient about cutting back on the amount  of soda that she consumes. She currently consumes a 16 oz bottle of coke or fanta daily. Explained to patient that she should cut back slowly to avoid caffeine withdraws. Patient does a good amount of walking with her job but does not do any extra exercising. Encouraged patient to try and start exercising on the days that she is not working.   Goal: Patient will start consuming one serving (1 cup) of vegetables daily. Patient will work on reaching this goal over the next month. Once patient has achieved this goal she will increase daily vegetable intake to 2-3 servings per day.  Patient will also work on cutting back on the amount of sodas that she consumes.    Navigation:  I will notify patient of lab results.  Patient is aware of 2 more health coaching sessions and a follow up.  Time: 20 minutes

## 2022-12-17 LAB — LIPID PANEL
Chol/HDL Ratio: 3.6 ratio (ref 0.0–4.4)
Cholesterol, Total: 171 mg/dL (ref 100–199)
HDL: 47 mg/dL (ref >39)
LDL Chol Calc (NIH): 106 mg/dL — ABNORMAL HIGH (ref 0–99)
Triglycerides: 98 mg/dL (ref 0–149)
VLDL Cholesterol Cal: 18 mg/dL (ref 5–40)

## 2022-12-17 LAB — HEMOGLOBIN A1C
Est. average glucose Bld gHb Est-mCnc: 105 mg/dL
Hgb A1c MFr Bld: 5.3 % (ref 4.8–5.6)

## 2022-12-17 LAB — GLUCOSE, RANDOM: Glucose: 97 mg/dL (ref 70–99)

## 2022-12-23 ENCOUNTER — Telehealth: Payer: Self-pay

## 2022-12-23 NOTE — Telephone Encounter (Signed)
Health coaching 2   interpreter- Natale Lay, [employer]   Labs-171 cholesterol, 106 LDL cholesterol, 98 triglycerides, 47 HDL cholesterol, 5.3 hemoglobin A1C, 97 mean plasma glucose. Patient understands and is aware of her lab results.   Goals-  1. Watch the amount of fried and fatty foods consumed. 2. Watch the amount of red meats consumed. Substitute for leaner proteins like chicken and fish. 3. Daily exercise for 20-30 minutes.   Navigation:  Patient is aware of 1 more health coaching sessions and a follow up.   Time- 10 minutes

## 2023-01-10 ENCOUNTER — Ambulatory Visit: Payer: Self-pay | Admitting: Internal Medicine

## 2023-01-10 NOTE — Progress Notes (Deleted)
   CC: Wellness visit  HPI:  [name] is a 47 y.o. with medical history of Prediabetes, and anemia presenting to [facility] for follow up.  Please see problem-based list for further details, assessments, and plans.  Past Medical History:  Diagnosis Date   Anemia    Pre-diabetes         Current Outpatient Medications (Hematological):    ferrous sulfate 325 (65 FE) MG tablet, Take 1 tablet (325 mg total) by mouth daily. Take ONLY every Monday, Wednesday, and Friday (Patient not taking: Reported on 12/16/2022)  Current Outpatient Medications (Other):    Multiple Vitamin (MULTIVITAMIN) tablet, Take 1 tablet by mouth daily. (Patient not taking: Reported on 12/16/2022)  Review of Systems:  Review of system negative unless stated in the problem list or HPI.    Physical Exam:  There were no vitals filed for this visit. Physical Exam General: NAD HENT: NCAT Lungs: CTAB, no wheeze, rhonchi or rales.  Cardiovascular: Normal heart sounds, no r/m/g, 2+ pulses in all extremities. No LE edema Abdomen: No TTP, normal bowel sounds MSK: No asymmetry or muscle atrophy.  Skin: no lesions noted on exposed skin Neuro: Alert and oriented x4. CN grossly intact Psych: Normal mood and normal affect   Assessment & Plan:   No problem-specific Assessment & Plan notes found for this encounter.   See Encounters Tab for problem based charting.  Patient Discussed with Dr. {NAMES:[id number]::"Guilloud","Hoffman","Mullen","Narendra","Vincent","Machen","Lau","Hatcher","Williams"} Sarah Abbot, MD Eligha Bridegroom. [facility] Internal Medicine Residency, PGY-3

## 2023-07-18 IMAGING — MG DIGITAL DIAGNOSTIC BILAT W/ TOMO W/ CAD
8 series · 8 of 24 positions shown · non-contrast
Comparison: Previous exam(s).

CLINICAL DATA: Follow-up probable benign fibroadenoma in the 11
o'clock position of the left breast. Two left breast cysts aspirated
07/14/2020. Intermittent shooting pains in both breasts for the past
3 months.

EXAM:
DIGITAL DIAGNOSTIC BILATERAL MAMMOGRAM WITH TOMOSYNTHESIS AND CAD;
ULTRASOUND LEFT BREAST LIMITED
TECHNIQUE: Bilateral digital diagnostic mammography and breast tomosynthesis
was performed. The images were evaluated with computer-aided
detection.; Targeted ultrasound examination of the left breast was
performed.

[R MLO synth-2D]
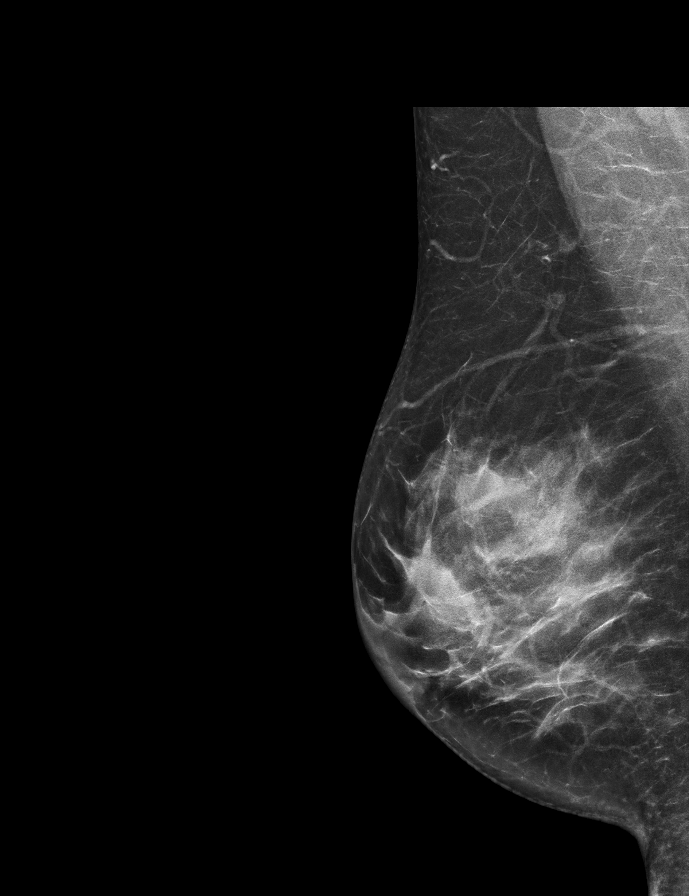

[L CC synth-2D]
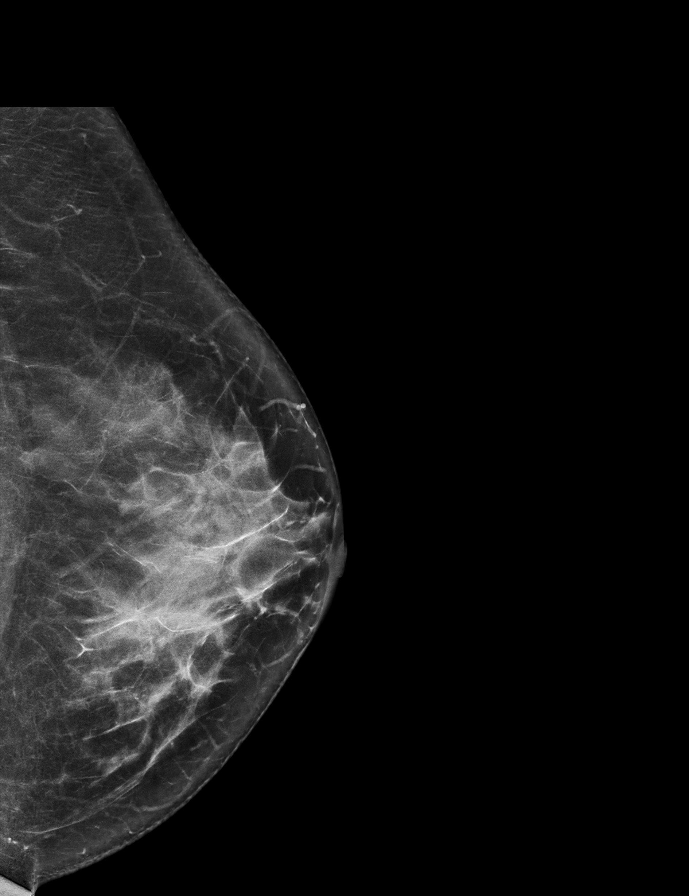

[R CC synth-2D]
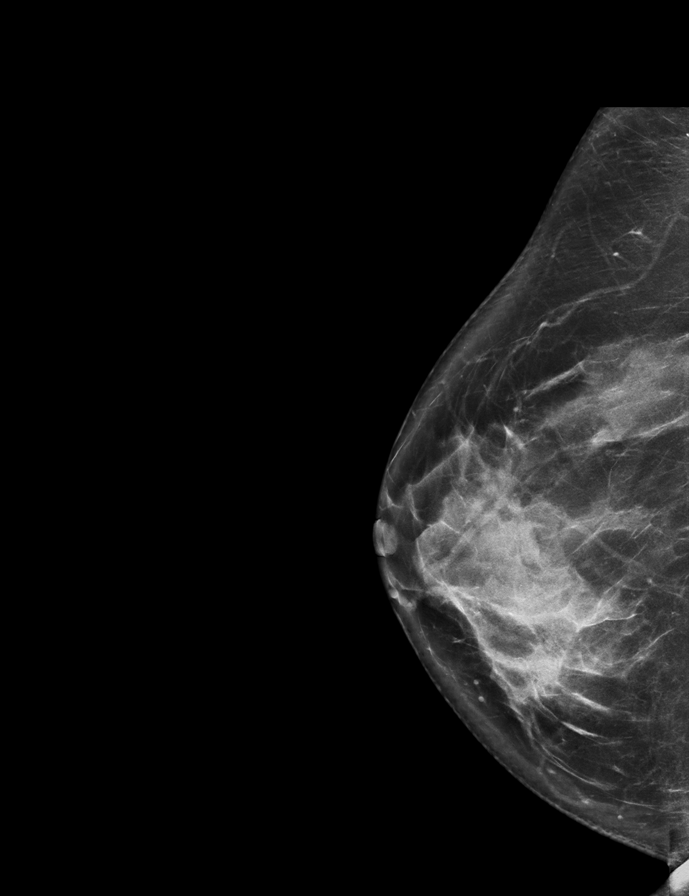

[L MLO synth-2D]
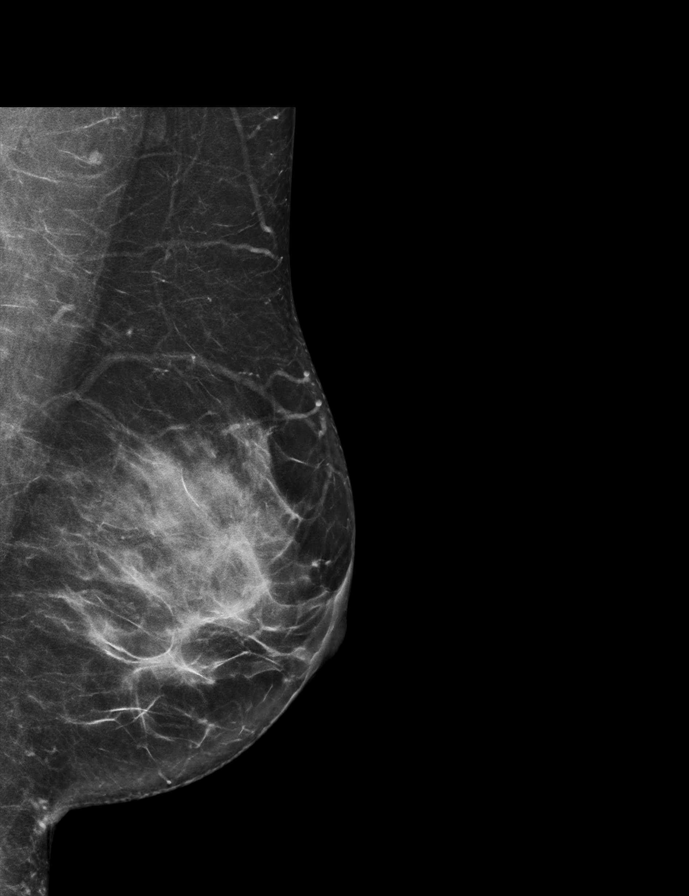

[L CC tomo · tomo slice 35/70.0]
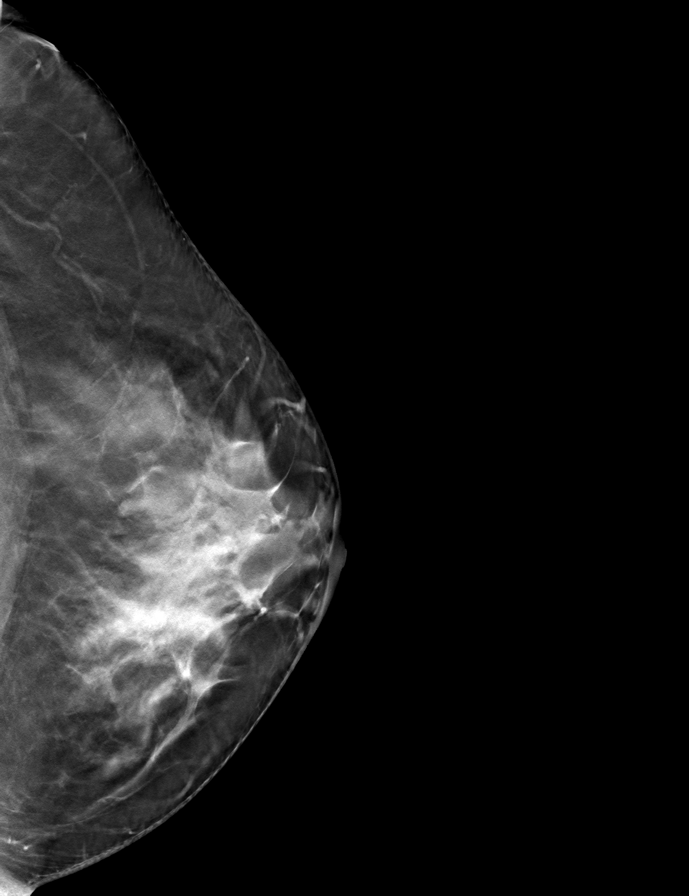

[R CC tomo · tomo slice 37/73.0]
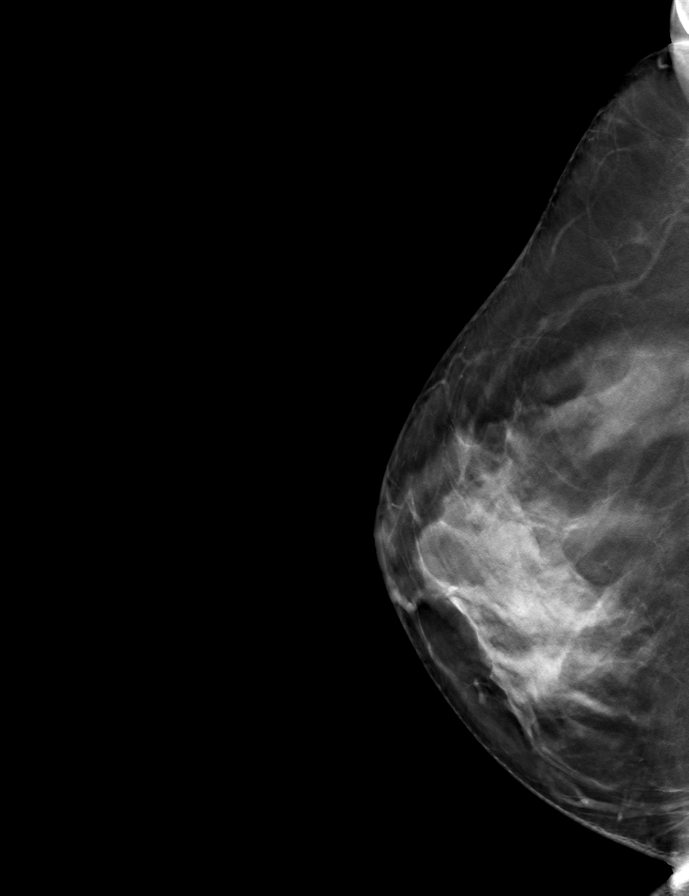

[R MLO tomo · tomo slice 35/68.0]
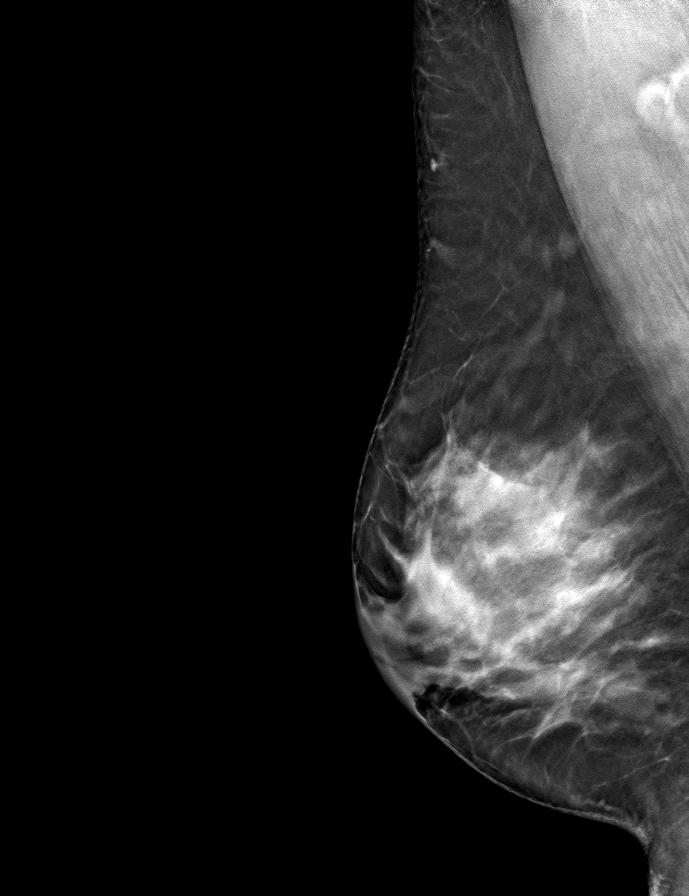

[L MLO tomo · tomo slice 35/70.0]
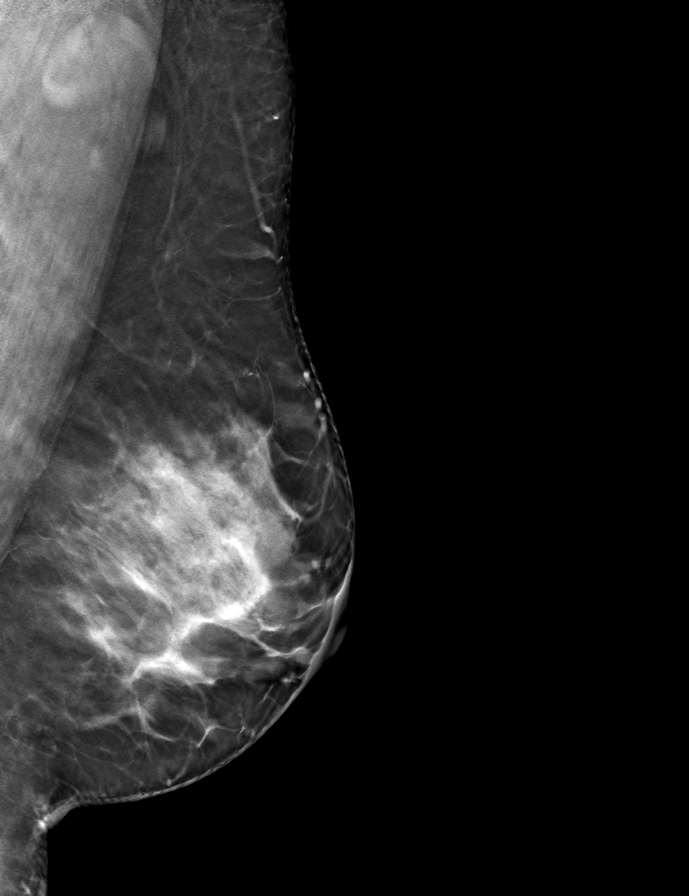

[8 of 24 positions shown; findings below may reference images not displayed]

ACR Breast Density Category d: The breast tissue is extremely dense,
which lowers the sensitivity of mammography.
FINDINGS: Mammographically normal appearing breasts with no findings
suspicious for malignancy in either breast.

Targeted ultrasound is performed, showing a 1.0 x 1.0 x 0 4 cm oval,
horizontally oriented, circumscribed, medium echotexture mass in the
11 o'clock position of the left breast, 1 cm from the nipple. This
measured 1.0 x 0.8 x 0.3 cm on 07/11/2020.
IMPRESSION: 1. No significant change in a probable benign fibroadenoma isolated
fat lobule in the 11 o'clock position of the left breast, 1 cm from
the nipple.
2. No evidence of malignancy elsewhere in either breast.

RECOMMENDATION:
Bilateral diagnostic mammogram and left breast ultrasound in 1 year
to complete 2 years of follow-up of the probably benign left breast
mass.

I have discussed the findings and recommendations with the patient.
If applicable, a reminder letter will be sent to the patient
regarding the next appointment.

BI-RADS CATEGORY  3: Probably benign.

## 2023-09-17 ENCOUNTER — Telehealth: Payer: Self-pay

## 2023-09-17 NOTE — Telephone Encounter (Signed)
 Health Coaching 3  interpreter- [facility] [place]  Spoke with patient about the benefits of having a heart healthy diet. Spoke about reducing the amount of fried and fatty foods consumed. Increasing daily fruit and vegetable intake. Also spoke about the importance of daily physical activity with a goal of 20-30 minutes daily. Will check in again with patient during follow-up visit to see how things are going.  Navigation:  Patient is aware of  a follow up session. Patient is scheduled for FU on 10/15/23.

## 2023-10-14 NOTE — Progress Notes (Signed)
 Wisewoman follow up   Interpreter: Herma Longest, [place]  Clinical Measurement:   Vitals:   10/15/23 1116 10/15/23 1144  BP: 119/75 117/73      Medical History: Patient states that she does not have high cholesterol, does not have high blood pressure and she does not have diabetes. Patient states that she does not have history of gestational hypertension, does not have history of pre-eclampsia/eclampsia and she does not have history of gestational diabetes.     Medications: Patient states that she does not take medication to lower cholesterol, blood pressure and blood sugar.  Patient does not take an aspirin a day to help prevent a heart attack or stroke.    Blood pressure, self measurement: Patient states that she does not measure blood pressure from home. She checks her blood pressure N/A. She shares her readings with a health care provider: N/A.   Nutrition: Patient states that on average she eats 2 cups of fruit and 0 cups of vegetables per day. Patient states that she does eat fish at least 2 times per week. Patient eats less than half servings of whole grains. Patient drinks less than 36 ounces of beverages with added sugar weekly: no. Patient is currently watching sodium or salt intake: yes. In the past 7 days patient has had 1 drinks containing alcohol. On average patient drinks 4 drinks containing alcohol per day.      Physical activity: Patient states that she gets 20 minutes of moderate and 0 minutes of vigorous physical activity each week.  Smoking status: Patient states that she has has never smoked .   Quality of life: Over the past 2 weeks patient states that she had little interest or pleasure in doing things: not at all. She has been feeling down, depressed or hopeless:not at all.   Social Determinants of Health Assessment:   Computer Use: During the last 12 months patient states that she has used any of the following: desktop/laptop, smart phone or tablet/other portable  wireless computer: yes.   Internet Use: During the last 12 months, did you or any member of your household have access to the internet: Yes, by paying a cell phone company or internet service provider.   Food Insecurities: During the last 12 months, where there any times when you were worried that you would run out of food because of a lack of money or other resources: No.   Transportation Barriers: During the last 12 months, have you missed a doctor's appointment because of transportation problems: No.   Childcare Barriers: If you are currently using childcare services, please identify  the type of services you use. (If not using childcare services, please select Not applicable): not applicable. During the last 12 months, have you had any barriers to childcare services such as: not applicable.   Housing: What is your housing situation today: I have housing.   Intimate Partner Violence: During the last 12 months, how often did your partner physically hurt you: never. During the last 12 months, how often did your partner insult you or talk down to you: never.  Medication Adherence: During the last 12 months, did you ever forget to take your medicine: not applicable. During the last 12 months, were you careless ar times about taking your medicine: not applicable. During the last 12 months, when you felt better did you sometimes stop taking your medication: not applicable. During the last 12 months, sometimes if you felt worse when you took your medicine did you stop  taking it: not applicable.    Risk reduction and counseling: Spoke with patient about adding more vegetables in her daily diet. Patient currently consumes a serving x3 weekly. Showed patient what a serving size would look like. Patient does not consume whole grains. Gave suggestions for whole wheat bread, oatmeal, whole grain cereals, brown rice or whole wheat pasta. Patient consumes a 24 oz bottle of coke daily. Explained to patient  that the recommendation is for 36 oz or less per week. Patient is going to try and cut back on the amount that she consumes. Patient has been walking once a week for 20 minutes. Encouraged her to try and aim for 20 minutes daily.    Navigation: This was the  follow up session for this patient, I will check up on her progress in the coming months.

## 2023-10-15 ENCOUNTER — Other Ambulatory Visit: Payer: Self-pay

## 2023-10-15 ENCOUNTER — Inpatient Hospital Stay: Payer: Self-pay | Attending: Obstetrics and Gynecology | Admitting: *Deleted

## 2023-10-15 VITALS — BP 117/73 | Ht 61.0 in | Wt 148.3 lb

## 2023-10-15 DIAGNOSIS — Z0000 Encounter for general adult medical examination without abnormal findings: Secondary | ICD-10-CM

## 2023-10-15 DIAGNOSIS — Z87898 Personal history of other specified conditions: Secondary | ICD-10-CM

## 2023-12-18 ENCOUNTER — Other Ambulatory Visit: Payer: Self-pay | Admitting: Obstetrics and Gynecology

## 2023-12-18 ENCOUNTER — Ambulatory Visit
Admission: RE | Admit: 2023-12-18 | Discharge: 2023-12-18 | Disposition: A | Discharge status: Home / Self Care | Source: Ambulatory Visit | Attending: Obstetrics and Gynecology | Admitting: Obstetrics and Gynecology

## 2023-12-18 ENCOUNTER — Ambulatory Visit
Admission: RE | Admit: 2023-12-18 | Discharge: 2023-12-18 | Disposition: A | Discharge status: Home / Self Care | Payer: Self-pay | Source: Ambulatory Visit | Attending: Obstetrics and Gynecology | Admitting: Obstetrics and Gynecology

## 2023-12-18 ENCOUNTER — Other Ambulatory Visit: Payer: Self-pay

## 2023-12-18 ENCOUNTER — Ambulatory Visit: Payer: Self-pay

## 2023-12-18 VITALS — BP 127/74 | Wt 148.6 lb

## 2023-12-18 DIAGNOSIS — Z87898 Personal history of other specified conditions: Secondary | ICD-10-CM

## 2023-12-18 DIAGNOSIS — Z01419 Encounter for gynecological examination (general) (routine) without abnormal findings: Secondary | ICD-10-CM

## 2023-12-18 DIAGNOSIS — Z1211 Encounter for screening for malignant neoplasm of colon: Secondary | ICD-10-CM

## 2023-12-18 DIAGNOSIS — Z1239 Encounter for other screening for malignant neoplasm of breast: Secondary | ICD-10-CM

## 2023-12-18 NOTE — Patient Instructions (Signed)
 Explained breast self awareness with Sarah Guerrero. Patient did not need a Pap smear today due to last Pap smear was 09/06/2021. Let patient know that based on her last Pap smear result that her next Pap smear is due in May 2026. Referred patient to the [facility] for a diagnostic mammogram per recommendation. Appointment scheduled Thursday, December 18, 2023 at 1450. Patient aware of appointment and will be there. [facility] Sarah Guerrero verbalized understanding.  Sarah Guerrero, [name], RN 11:56 AM

## 2023-12-18 NOTE — Progress Notes (Signed)
 Sarah Guerrero is a 48 y.o. female who presents to [facility] clinic today with no complaints. Patient had a diagnostic mammogram completed 09/06/2021 that was probably benign that a bilateral diagnostic mammogram was recommended in one year that was not completed.   Pap Smear: Pap smear not completed today. Last Pap smear was 09/06/2021 at [facility] clinic and was abnormal - ASCUS with negative HPV. Patients previous Pap smear was 05/21/2016 that was normal with negative HPV. Patient has history of an abnormal Pap smear 10/06/2015 that was ASC-H and had a colposcopy 11/09/2015 to follow up that was benign. Last Pap smear result is available in Epic.    Physical exam: Breasts Breasts symmetrical. No skin abnormalities bilateral breasts. No nipple retraction bilateral breasts. No nipple discharge bilateral breasts. No lymphadenopathy. No lumps palpated bilateral breasts. No complaints of pain or tenderness on exam.       MS 3D DIAG MAMMO BILAT BR (aka MM) Result Date: 12/18/2023 CLINICAL DATA:  48 year old female presents for delayed 2 year follow-up of LEFT breast mass, and for annual bilateral mammogram. EXAM: DIGITAL DIAGNOSTIC BILATERAL MAMMOGRAM WITH TOMOSYNTHESIS AND CAD; ULTRASOUND RIGHT BREAST LIMITED; ULTRASOUND LEFT BREAST LIMITED TECHNIQUE: Bilateral digital diagnostic mammography and breast tomosynthesis was performed. The images were evaluated with computer-aided detection. ; Targeted ultrasound examination of the right breast was performed; Targeted ultrasound examination of the left breast was performed. COMPARISON:  Previous exam(s). ACR Breast Density Category c: The breasts are heterogeneously dense, which may obscure small masses. FINDINGS: Full field views of both breasts demonstrate a new circumscribed oval mass within the RETROAREOLAR RIGHT breast. No other new or suspicious findings within either breast identified. Targeted ultrasound is performed, showing a 2.4 x 2.4 x 1.1  cm benign cyst in the RETROAREOLAR RIGHT breast and a stable 1 x 0.5 x 0.9 cm circumscribed oval isoechoic LEFT breast mass at the 11 o'clock position 1 cm from the nipple. IMPRESSION: 1. Stable UPPER INNER LEFT breast mass, considered benign given over a 2 year stability and appearance. No further imaging follow-up recommended. 2. New benign cyst in the RETROAREOLAR RIGHT breast. 3. No evidence of breast malignancy. RECOMMENDATION: Bilateral screening mammogram in 1 year. I have discussed the findings and recommendations with the patient. If applicable, a reminder letter will be sent to the patient regarding the next appointment. BI-RADS CATEGORY  2: Benign. Electronically Signed   By: Reyes Phi M.D.   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 Two left breast cysts aspirated 07/14/2020. Intermittent shooting pains in both breasts for the past 3 months. EXAM: DIGITAL DIAGNOSTIC BILATERAL MAMMOGRAM WITH TOMOSYNTHESIS AND CAD; ULTRASOUND LEFT BREAST LIMITED TECHNIQUE: Bilateral digital diagnostic mammography and breast tomosynthesis was performed. The images were evaluated with computer-aided detection.; Targeted ultrasound examination of the left breast was performed. COMPARISON:  Previous exam(s). ACR Breast Density Category d: The breast tissue is extremely dense, which lowers the sensitivity of mammography. FINDINGS: Mammographically normal appearing breasts with no findings suspicious for malignancy in either breast. Targeted ultrasound is performed, showing a 1.0 x 1.0 x 0 4 cm oval, horizontally oriented, circumscribed, medium echotexture mass in the 11 o'clock position of the left breast, 1 cm from the nipple. This measured 1.0 x 0.8 x 0.3 cm on 07/11/2020. IMPRESSION: 1. 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 2. No evidence of malignancy elsewhere in either breast. RECOMMENDATION: Bilateral diagnostic mammogram and left breast ultrasound in 1 year to complete 2 years of follow-up of the probably benign left breast mass. I have discussed the findings and recommendations with the patient. If applicable, a reminder letter will be sent to the patient regarding the next appointment. BI-RADS CATEGORY  3: Probably benign. Electronically Signed   By: Elspeth Bathe M.D.   On: 09/06/2021 13:41  MS DIGITAL DIAG TOMO BILAT Result Date: 07/11/2020 CLINICAL DATA:  48 year old female with focal pain along the medial left breast. EXAM: DIGITAL DIAGNOSTIC BILATERAL MAMMOGRAM WITH TOMOSYNTHESIS AND CAD; ULTRASOUND LEFT BREAST LIMITED TECHNIQUE: Bilateral digital diagnostic mammography and breast tomosynthesis was performed. The images were evaluated with computer-aided detection.; Targeted ultrasound examination of the left breast was performed COMPARISON:  Previous exam(s). ACR Breast Density Category c: The breast tissue is heterogeneously dense, which may obscure small masses. FINDINGS: A radiopaque BB was placed at the site of the patient's focal pain in the medial left breast. An oval, circumscribed equal density mass is seen just medial to the BB. Otherwise, no suspicious mammographic findings in the remainder of either breast. Targeted ultrasound is performed, showing 2 adjacent oval, circumscribed hypoechoic masses at the 9:30 position 7 cm from the nipple. 1 minutes mass is wider than tall and has the appearance of a complicated cyst. It measures 3 x 3 x 2 mm. A second mass is taller than wide and has slightly irregular margins, measuring 4 x 3 x 2 mm. This demonstrates mild shadowing but no vascularity. Additionally, an incidental oval, circumscribed hypoechoic mass is demonstrated at the 11 o'clock position 1 cm from the nipple. It measures 10 x 8 x 3 mm. There is no internal  vascularity. Evaluation of the left axilla demonstrates no suspicious lymphadenopathy. IMPRESSION: 1. Two indeterminate hypoechoic masses at the 9:30 position 7 cm from the nipple on the left. 2. 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 3. No suspicious left axillary lymphadenopathy. 4. No mammographic evidence of malignancy on the right. RECOMMENDATION: 1. Ultrasound-guided aspiration is recommended for the 2 adjacent hypoechoic masses at the 9:30 position 7 cm from the nipple on the left. If they do not aspirate to completion, ultrasound-guided biopsy is recommended. 2. If above aspiration/biopsy demonstrates benign results, 6 months ultrasound follow-up is recommended for the additional mass at the 11 o'clock position on the left. I have discussed the findings and recommendations with the patient. If applicable, a reminder letter will be sent to the patient regarding the next appointment. BI-RADS CATEGORY  4: Suspicious. Electronically Signed   By: Serena  Chacko M.D.   On: 07/11/2020 16:14   Pelvic/Bimanual Pap is not indicated today per BCCCP guidelines.   Smoking History: Patient has never smoked.   Patient Navigation: Patient education provided. Access to services provided for patient through [facility] program. Spanish interpreter Bernice Angry from [facility] provided.    Colorectal Cancer Screening: Per patient has never had colonoscopy completed. FIT Test completed 09/06/2021 that was negative. FIT Test given to patient today to complete. No complaints today.    Breast and Cervical Cancer Risk Assessment: Patient does not have family history of breast cancer, known genetic mutations, or radiation treatment to the chest before age 50. Patient has history of cervical dysplasia. Patient has no history of being immunocompromised or DES exposure in-utero.  Risk Scores as of Encounter on 12/18/2023     [place]           5-year 0.79%   Lif[place]  7.36%   This patient is [place]/Latina but has no documented birth country, so the [place] model used data from [place] patients to calculate their risk score. Document a birth country in the Demographics activity for a more accurate score.         Last calculated by Logan Lyle BRAVO, CMA on 12/18/2023 at 11:54 AM        A: BCCCP exam without pap smear No complaints.  P: Referred patient to the [facility] for a diagnostic mammogram per recommendation. Appointment scheduled Thursday, December 18, 2023 at 1450.SABRA  Driscilla Wanda SQUIBB, RN 12/18/2023 11:56 AM

## 2023-12-22 ENCOUNTER — Other Ambulatory Visit: Payer: Self-pay

## 2023-12-22 ENCOUNTER — Inpatient Hospital Stay: Payer: Self-pay | Attending: Obstetrics and Gynecology | Admitting: *Deleted

## 2023-12-22 VITALS — BP 110/76 | Ht 61.0 in | Wt 149.3 lb

## 2023-12-22 DIAGNOSIS — Z0000 Encounter for general adult medical examination without abnormal findings: Secondary | ICD-10-CM

## 2023-12-22 LAB — CYTOLOGY - PAP
Comment: NEGATIVE
Diagnosis: NEGATIVE
Diagnosis: REACTIVE
High risk HPV: NEGATIVE

## 2023-12-22 NOTE — Progress Notes (Signed)
 [facility] Re-Screening   Interpreter- Bernice Angry, [place]   Clinical Measurement:  Vitals:   12/22/23 0934 12/22/23 0946  BP: 102/72 110/76   Fasting Labs Drawn Today, will review with patient when they result.   Medical History: Patient states that she does not have high cholesterol, does not have high blood pressure and she does not have diabetes. Patient states that she does not have history of gestational hypertension, does not have history of pre-eclampsia/eclampsia and she does not have history of gestational diabetes.    Medications: Patient states that she does not take medication to lower cholesterol, blood pressure or blood sugar.  Patient does not take an aspirin a day to help prevent a heart attack or stroke.    Blood pressure, self measurement: Patient states that she does not measure blood pressure from home. She checks her blood pressure N/A. She shares her readings with a health care provider: N/A.   Nutrition: Patient states that on average she eats 5 cups of fruit and 0 cups of vegetables per day. Patient states that she does eat fish at least 2 times per week. Patient eats less than half servings of whole grains. Patient drinks less than 36 ounces of beverages with added sugar weekly: no. Patient is currently watching sodium or salt intake: yes. In the past 7 days patient has consumed drinks containing alcohol on 2 days. On a day that patient consumes drinks containing alcohol on average 6 drinks are consumed.      Physical activity: Patient states that she gets 80 minutes of physical activity each week.  Smoking status: Patient states that she has has never smoked .   Quality of life: Over the past 2 weeks patient states that she had little interest or pleasure in doing things: not at all. She has been feeling down, depressed or hopeless:not at all.   Social Determinants of Health Assessment:   Computer Use: During the last 12 months patient states that she has used  any of the following: desktop/laptop, smart phone or tablet/other portable wireless computer: yes.   Internet Use: During the last 12 months, did you or any member of your household have access to the internet: Yes, by paying a cell phone company or internet service provider.   Food Insecurities: During the last 12 months, where there any times when you were worried that you would run out of food because of a lack of money or other resources: No.   Transportation Barriers: During the last 12 months, have you missed a doctor's appointment because of transportation problems: No.   Childcare Barriers: If you are currently using childcare services, please identify  the type of services you use. (If not using childcare services, please select Not applicable): not applicable. During the last 12 months, have you had any barriers to childcare services such as: not applicable.   Housing: What is your housing situation today: I have housing.   Intimate Partner Violence: During the last 12 months, how often did your partner physically hurt you: never. During the last 12 months, how often did your partner insult you or talk down to you: never.  Medication Adherence: During the last 12 months, did you ever forget to take your medicine: not applicable. During the last 12 months, were you careless ar times about taking your medicine: not applicable. During the last 12 months, when you felt better did you sometimes stop taking your medication: not applicable. During the last 12 months, sometimes if you felt  worse when you took your medicine did you stop taking it: not applicable.   Risk reduction and counseling:   Health Coaching: Spoke with patient about the daily recommendations for fruits and vegetables. Patient consumes 5 servings of fruit daily. Patient does not consume vegetables daily but usually does at least 3 days of the week. Patient does consume fish usually 3 times per week but does fry it most of  the time. Spoke about heart healthy ways that she can prepare fish (grill, bake, broil, sautee w/small amt of olive oil or air frying. Patient does not consume any whole grains. Explained to patient how whole grains can be beneficial to cardiovascular health. Gave suggestions for whole wheat bread, oatmeal, whole grain cereals, quinoa, brown rice or whole wheat pasta. Patient consumes on average 1-2 mini bottles of coke daily. Spoke with patient about sugar intake. Patient has been walking 4 days a week for 20-30 minutes.  Goal: Decrease daily soda intake. Patient will decrease her daily coke intake from 2 mini bottles (8.6 oz each) to 1 mini bottle daily.    Navigation:  I will notify patient of lab results. Patient is aware of 2 more health coaching sessions and a follow up.

## 2023-12-22 NOTE — Progress Notes (Signed)
 Gave patient pap results during wise woman visit. Informed patient that pap smear was normal and HPV was negative. Based on this result her next pap smear will be due in 1 year due to her previous hx. Patient voiced understanding.

## 2023-12-23 LAB — HEMOGLOBIN A1C
Est. average glucose Bld gHb Est-mCnc: 111 mg/dL
Hgb A1c MFr Bld: 5.5 % (ref 4.8–5.6)

## 2023-12-23 LAB — LIPID PANEL
Chol/HDL Ratio: 4.5 ratio — ABNORMAL HIGH (ref 0.0–4.4)
Cholesterol, Total: 239 mg/dL — ABNORMAL HIGH (ref 100–199)
HDL: 53 mg/dL (ref >39)
LDL Chol Calc (NIH): 162 mg/dL — ABNORMAL HIGH (ref 0–99)
Triglycerides: 134 mg/dL (ref 0–149)
VLDL Cholesterol Cal: 24 mg/dL (ref 5–40)

## 2023-12-23 LAB — GLUCOSE, RANDOM: Glucose: 91 mg/dL (ref 70–99)

## 2023-12-30 ENCOUNTER — Ambulatory Visit: Payer: Self-pay

## 2023-12-30 NOTE — Telephone Encounter (Signed)
 Health coaching 2   interpreter- [employer] # [phone]   Labs- 239 cholesterol, 162 LDL cholesterol, 134 triglycerides, 53 HDL cholesterol, 5.5 hemoglobin A1C, 91 mean plasma glucose. Patient understands and is aware of her lab results.   Goals-  1. Reduce the amount of fried and fatty foods consumed. Try to grill, bake, broil or sautee foods in a small amount of olive oil instead. 2. Reduce the amount of red meats consumed. Substitute for lean proteins like chicken or fish. 3. Reduce the amount of whole fat dairy products consumed. Substitute for low fat or reduce fat options instead. 4. Daily exercise for 20-30 minutes.    Navigation:  Patient is aware of 1 more health coaching sessions and a follow up. Patient is scheduled for FU with Internal Medicine for FU on January 12, 2024 @ 10:25 am.

## 2024-01-11 NOTE — Progress Notes (Unsigned)
 Established Patient Office Visit  Subjective   Patient ID: Sarah Guerrero [facility] Tamea Nap, female    DOB: 05-28-1975  Age: 48 y.o. MRN: [id number]  Chief Complaint  Patient presents with   Follow-up     Follow up from [facility] clinic for elevated lab work  / joint pian /did not want flu shot.    [facility] Sarah Guerrero is a 48 y.o. who presents to the clinic for follow-up from the wise woman laboratory work and symmetrical and, wrist, elbow pain.  Please see problem based assessment and plan for additional details.    Patient Active Problem List   Diagnosis Date Noted   HLD (hyperlipidemia) 01/13/2024   Joint pain in both hands, wrists, elbows 01/13/2024   Abdominal pain 11/20/2021   Well woman exam 11/20/2021   Lower back pain 09/14/2020   Abdominal cramps 09/14/2020   Iron  deficiency anemia 09/14/2020   Health care maintenance 09/14/2020   History of cervical dysplasia 11/07/2015      Objective:     BP 124/71 (BP Location: Left Arm, Patient Position: Sitting, Cuff Size: Normal)   Pulse 62   Temp 98.1 F (36.7 C) (Oral)   Ht 5' 1 (1.549 m)   Wt 148 lb 3.2 oz (67.2 kg)   LMP  (Within Weeks)   SpO2 95%   BMI 28.00 kg/m  BP Readings from Last 3 Encounters:  01/12/24 124/71  12/22/23 110/76  12/18/23 127/74   Wt Readings from Last 3 Encounters:  01/12/24 148 lb 3.2 oz (67.2 kg)  12/22/23 149 lb 4.8 oz (67.7 kg)  12/18/23 148 lb 9.6 oz (67.4 kg)      Physical Exam Vitals reviewed.  Constitutional:      General: She is not in acute distress.    Appearance: She is not ill-appearing, toxic-appearing or diaphoretic.  Cardiovascular:     Rate and Rhythm: Normal rate and regular rhythm.     Heart sounds: No murmur heard. Pulmonary:     Effort: Pulmonary effort is normal.     Breath sounds: Normal breath sounds.  Musculoskeletal:     Right elbow: No swelling or effusion. Normal range of motion. No tenderness.     Left elbow: No swelling  or effusion. Normal range of motion. No tenderness.     Right wrist: No tenderness. Normal range of motion.     Left wrist: No tenderness. Normal range of motion.     Right hand: Tenderness (PIP) present.     Left hand: Tenderness (PIP) present.     Comments: -bilateral PIP tenderness to palpation without warmth or bogginess on exam -full range of motion of the bilateral elbows, wrist, PIP joints.  -No warmth, bogginess, edema, or erythema to the joints listed above. -No MCP pain on exam   Neurological:     Mental Status: She is alert.  Psychiatric:        Mood and Affect: Mood and affect normal.      Results for orders placed or performed in visit on 01/12/24  Anti-CCP Ab, IgG + IgA (RDL)  Result Value Ref Range   Anti-CCP Ab, IgG + IgA (RDL) WILL FOLLOW   Rheumatoid (RA) Factor  Result Value Ref Range   Rheumatoid fact SerPl-aCnc 16.2 (H) <14.0 IU/mL    Last metabolic panel Lab Results  Component Value Date   GLUCOSE 91 12/22/2023   NA 140 12/01/2017   K 3.8 12/01/2017   CL 104 12/01/2017   CO2 22  12/01/2017   BUN 9 12/01/2017   CREATININE 0.62 12/01/2017   GFRNONAA 110 12/01/2017   CALCIUM 8.8 12/01/2017   PROT 7.8 12/01/2017   ALBUMIN 4.4 12/01/2017   LABGLOB 3.4 12/01/2017   AGRATIO 1.3 12/01/2017   BILITOT <0.2 12/01/2017   ALKPHOS 57 12/01/2017   AST 19 12/01/2017   ALT 19 12/01/2017   Last lipids Lab Results  Component Value Date   CHOL 239 (H) 12/22/2023   HDL 53 12/22/2023   LDLCALC 162 (H) 12/22/2023   TRIG 134 12/22/2023   CHOLHDL 4.5 (H) 12/22/2023   Last hemoglobin A1c Lab Results  Component Value Date   HGBA1C 5.5 12/22/2023      The 10-year ASCVD risk score (Arnett DK, et al., 2019) is: 1.6%    Assessment & Plan:   Problem List Items Addressed This Visit       Other   HLD (hyperlipidemia)   Patient presents with hyperlipidemia.  Her lipid profile revealed an LDL of 162.  Patient's 10-year ASCVD risk score is 1.6%.  Patient  reports eating a diet with primarily fried foods. Plan: - Counseled patient on importance of exercise and diet.  Patient was asked to limit fried foods from her diet if she is unable to completely limit them.  Patient was instructed to eat lean meats such as fish & chicken. - Statin initiation is not recommended at this time      Joint pain in both hands, wrists, elbows   Patient presents with a 11-month history of symmetrical elbow, wrist, PIP joint pain.  She stated that she has stiffness in her hands wrists and elbows in the morning and improves with movement at work.  For work, she does pack packages.  She has not tried any over-the-counter medications for this pain.  On exam, she has bilateral PIP tenderness to palpation without warmth or bogginess on exam.  She has full range of motion of the bilateral elbows, wrist, PIP joints.  She does not have any warmth, bogginess, edema, or erythema to the joints listed above.  With patient's symmetrical joint pain and classic history of stiffness in the morning that improves with movement, I do suspect that patient has rheumatoid arthritis.  Plan: - Rheumatoid factor ordered -Anti-CCP antibody ordered -If these are positive, would recommend that patient follows with rheumatology; however, patient is uninsured.  Patient will need to fill out financial paperwork that was provided to her.      Other Visit Diagnoses       Arthralgia of both hands    -  Primary   Relevant Orders   Rheumatoid (RA) Factor (Completed)   Anti-CCP Ab, IgG + IgA (RDL) (Completed)       Return in about 3 months (around 04/12/2024) for Joint pain follow up and preventive care .    Damien Lease, DO

## 2024-01-12 ENCOUNTER — Other Ambulatory Visit: Payer: Self-pay

## 2024-01-12 ENCOUNTER — Ambulatory Visit (INDEPENDENT_AMBULATORY_CARE_PROVIDER_SITE_OTHER): Payer: Self-pay | Admitting: Student

## 2024-01-12 VITALS — BP 124/71 | HR 62 | Temp 98.1°F | Ht 61.0 in | Wt 148.2 lb

## 2024-01-12 DIAGNOSIS — E785 Hyperlipidemia, unspecified: Secondary | ICD-10-CM

## 2024-01-12 DIAGNOSIS — M25541 Pain in joints of right hand: Secondary | ICD-10-CM

## 2024-01-12 DIAGNOSIS — M25542 Pain in joints of left hand: Secondary | ICD-10-CM

## 2024-01-12 NOTE — Patient Instructions (Addendum)
 Gracias, Sra. Sarah Guerrero, por permitirnos atenderlo hoy. Hoy hablamos sobre el colesterol y Financial controller.  Para el colesterol: Por favor, elimine las frituras y trate de comer carnes magras (pescado, pollo).  Para el dolor articular: Me preocupa que usted tenga un tipo de artritis llamada artritis reumatoide, pero necesitamos pruebas para el diagnstico. Ya solicit esas pruebas y puede hacrselas cuando pueda. Costarn entre $75 y $150.  Mientras tanto: por favor, use ibuprofeno y gel tpico (Voltaren gel). Cuando use ibuprofeno, coma y tenga en cuenta que puede Pharmacist, community. Tome hasta 400 mg al da segn sea necesario. Tambin puede comprar VOLTAREN gel sin receta. Aplquelo en las articulaciones!  He solicitado los siguientes anlisis de laboratorio para usted:  Ordenes de anlisis Factor reumatoide (AR) Ab anti-CCP, IgG + IgA (RDL)  Referencias solicitadas hoy:  Ordenes de referencia No se ha solicitado ninguna referencia hoy  He solicitado/cambiado los siguientes medicamentos:  Suspender los siguientes medicamentos: No hay medicamentos suspendidos.  Iniciar los siguientes medicamentos: No se realizaron pedidos de los tipos definidos en esta consulta.  Seguimiento: 3 meses  Si tiene jersey pregunta o inquietud, llame a la clnica de medicina interna al [phone].  Tenga en cuenta que nuestra poltica de retrasos ha cambiado. Si llega ms de 15 minutos tarde a su cita, es posible que se Publishing rights manager.  Dr. Kandis, D.O. [facility]    Thank you, [name] for allowing us  to provide your care today. Today we discussed cholesterol and joint pain.  For the cholesterol: Please cut out fried foods and try to eat lean mets (fish, chicken)   For the joint pain: I am concerned that you man have a type of arthritis called rheumatoid arthritis but we need to collect  tests for the diagnosis. I have ordered those tests and you can get them done when you can. They will be about 75$-150$.   In the mean time: please use ibuprofen and topical gel (Voltaren gel). When you use ibuprofen, please eat and be aware that it can irritate the stomach. Take  to 400 mg daily as needed. You can also purchase VOLTAREN gel over the counter. Apply this to your joints!  I have ordered the following labs for you:   Lab Orders         Rheumatoid (RA) Factor         Anti-CCP Ab, IgG + IgA (RDL)       Referrals ordered today:   Referral Orders  No referral(s) requested today     I have ordered the following medication/changed the following medications:   Stop the following medications: There are no discontinued medications.   Start the following medications: No orders of the defined types were placed in this encounter.    Follow up: 3 months    Should you have any questions or concerns please call the internal medicine clinic at [phone].     Please note that our late policy has changed.  If you are more than 15 minutes late to your appointment, you may be asked to reschedule your appointment.  Dr. Kandis, D.O. [facility]

## 2024-01-13 DIAGNOSIS — E785 Hyperlipidemia, unspecified: Secondary | ICD-10-CM | POA: Insufficient documentation

## 2024-01-13 DIAGNOSIS — M25542 Pain in joints of left hand: Secondary | ICD-10-CM | POA: Insufficient documentation

## 2024-01-13 NOTE — Assessment & Plan Note (Addendum)
 Patient presents with hyperlipidemia.  Her lipid profile revealed an LDL of 162.  Patient's 10-year ASCVD risk score is 1.6%.  Patient reports eating a diet with primarily fried foods. Plan: - Counseled patient on importance of exercise and diet.  Patient was asked to limit fried foods from her diet if she is unable to completely limit them.  Patient was instructed to eat lean meats such as fish & chicken. - Statin initiation is not recommended at this time

## 2024-01-13 NOTE — Assessment & Plan Note (Signed)
 Patient presents with a 3-month history of symmetrical elbow, wrist, PIP joint pain.  She stated that she has stiffness in her hands wrists and elbows in the morning and improves with movement at work.  For work, she does pack packages.  She has not tried any over-the-counter medications for this pain.  On exam, she has bilateral PIP tenderness to palpation without warmth or bogginess on exam.  She has full range of motion of the bilateral elbows, wrist, PIP joints.  She does not have any warmth, bogginess, edema, or erythema to the joints listed above.  With patient's symmetrical joint pain and classic history of stiffness in the morning that improves with movement, I do suspect that patient has rheumatoid arthritis.  Plan: - Rheumatoid factor ordered -Anti-CCP antibody ordered -If these are positive, would recommend that patient follows with rheumatology; however, patient is uninsured.  Patient will need to fill out financial paperwork that was provided to her.

## 2024-01-16 LAB — RHEUMATOID FACTOR: Rheumatoid fact SerPl-aCnc: 16.2 [IU]/mL — ABNORMAL HIGH (ref <14.0)

## 2024-01-16 LAB — ANTI-CCP AB, IGG + IGA (RDL): Anti-CCP Ab, IgG + IgA (RDL): <20 U (ref <20)

## 2024-01-21 ENCOUNTER — Ambulatory Visit: Payer: Self-pay | Admitting: Student

## 2024-01-24 NOTE — Progress Notes (Signed)
 [facility] Attending  Case discussed with the resident at the time of the visit.  We reviewed the resident's history and exam and pertinent patient test results.  I agree with the assessment, diagnosis, and plan of care documented in the resident's note.
